# Patient Record
Sex: Female | Born: 1990 | Race: Black or African American | Hispanic: No | Marital: Married | Smoking: Never smoker
Health system: Southern US, Community
[De-identification: ages and names within clinical notes are randomized; demographics above are authoritative.]

## PROBLEM LIST (undated history)

## (undated) DIAGNOSIS — Z789 Other specified health status: Secondary | ICD-10-CM

## (undated) HISTORY — PX: NO PAST SURGERIES: SHX2092

## (undated) HISTORY — PX: ABDOMINAL HYSTERECTOMY: SHX81

---

## 2011-07-25 ENCOUNTER — Inpatient Hospital Stay (HOSPITAL_COMMUNITY): Payer: Medicaid Other

## 2011-07-25 ENCOUNTER — Encounter (HOSPITAL_COMMUNITY): Payer: Self-pay | Admitting: *Deleted

## 2011-07-25 ENCOUNTER — Inpatient Hospital Stay (HOSPITAL_COMMUNITY)
Admission: AD | Admit: 2011-07-25 | Discharge: 2011-08-10 | DRG: 782 | Disposition: A | Discharge status: Home / Self Care | Payer: Medicaid Other | Source: Ambulatory Visit | Attending: Obstetrics and Gynecology | Admitting: Obstetrics and Gynecology

## 2011-07-25 DIAGNOSIS — O4700 False labor before 37 completed weeks of gestation, unspecified trimester: Secondary | ICD-10-CM | POA: Diagnosis present

## 2011-07-25 DIAGNOSIS — O479 False labor, unspecified: Secondary | ICD-10-CM

## 2011-07-25 DIAGNOSIS — O4410 Placenta previa with hemorrhage, unspecified trimester: Principal | ICD-10-CM | POA: Diagnosis present

## 2011-07-25 HISTORY — DX: Other specified health status: Z78.9

## 2011-07-25 LAB — CBC
MCV: 82.4 fL (ref 78.0–100.0)
Platelets: 210 10*3/uL (ref 150–400)
RBC: 4.15 MIL/uL (ref 3.87–5.11)
RDW: 15.2 % (ref 11.5–15.5)
WBC: 7 10*3/uL (ref 4.0–10.5)

## 2011-07-25 MED ORDER — MAGNESIUM SULFATE 40 MG/ML IJ SOLN: 4.0000 g | Freq: Once | INTRAMUSCULAR | Status: DC

## 2011-07-25 MED ORDER — ZOLPIDEM TARTRATE 10 MG PO TABS
10.0000 mg | ORAL_TABLET | Freq: Every evening | ORAL | Status: DC | PRN
  Administered 2011-07-26 – 2011-08-07 (×5): 10 mg via ORAL
  Filled 2011-07-25 (×5): qty 1

## 2011-07-25 MED ORDER — MAGNESIUM SULFATE 40 G IN LACTATED RINGERS - SIMPLE
2.0000 g/h | INTRAVENOUS | Status: DC
  Administered 2011-07-25: 2 g/h via INTRAVENOUS
  Filled 2011-07-25: qty 500

## 2011-07-25 MED ORDER — PRENATAL PLUS 27-1 MG PO TABS
1.0000 | ORAL_TABLET | Freq: Every day | ORAL | Status: DC
  Administered 2011-07-25 – 2011-08-10 (×17): 1 via ORAL
  Filled 2011-07-25 (×20): qty 1

## 2011-07-25 MED ORDER — DOCUSATE SODIUM 100 MG PO CAPS
100.0000 mg | ORAL_CAPSULE | Freq: Every day | ORAL | Status: DC
  Administered 2011-07-26 – 2011-08-10 (×17): 100 mg via ORAL
  Filled 2011-07-25 (×22): qty 1

## 2011-07-25 MED ORDER — ACETAMINOPHEN 325 MG PO TABS: 650.0000 mg | ORAL_TABLET | ORAL | Status: DC | PRN

## 2011-07-25 MED ORDER — MAGNESIUM SULFATE BOLUS VIA INFUSION
4.0000 g | Freq: Once | INTRAVENOUS | Status: AC
  Administered 2011-07-25: 4 g via INTRAVENOUS
  Filled 2011-07-25: qty 500

## 2011-07-25 MED ORDER — LACTATED RINGERS IV SOLN
INTRAVENOUS | Status: DC
  Administered 2011-07-28: 03:00:00 via INTRAVENOUS

## 2011-07-25 MED ORDER — CALCIUM CARBONATE ANTACID 500 MG PO CHEW
2.0000 | CHEWABLE_TABLET | ORAL | Status: DC | PRN
  Administered 2011-07-27: 400 mg via ORAL
  Filled 2011-07-25 (×2): qty 2

## 2011-07-25 MED ORDER — LACTATED RINGERS IV SOLN
INTRAVENOUS | Status: DC
  Administered 2011-07-25: 125 mL/h via INTRAVENOUS
  Administered 2011-07-25 – 2011-07-28 (×8): via INTRAVENOUS
  Administered 2011-07-28: 125 mL/h via INTRAVENOUS
  Administered 2011-07-29 – 2011-07-31 (×5): via INTRAVENOUS

## 2011-07-25 MED ORDER — BETAMETHASONE SOD PHOS & ACET 6 (3-3) MG/ML IJ SUSP
12.0000 mg | INTRAMUSCULAR | Status: AC
  Administered 2011-07-25 – 2011-07-26 (×2): 12 mg via INTRAMUSCULAR
  Filled 2011-07-25 (×2): qty 2

## 2011-07-25 NOTE — Consult Note (Signed)
Neonatology Consult to Antenatal Patient:  Ms. Kari Collier is admitted today with onset of vaginal bleeding and complete placenta previa at 62 3/[redacted] weeks GA. She is currently having some contractions. She is getting BMZ and Magnesium sulfate. The EFW is 3 pounds and the fetus is a female.  I spoke with the patient and 2 female relatives. We discussed the worst case of delivery in the next 1-2 days, including usual DR management, possible respiratory complications and need for support, IV access, feedings (mother desires breast feeding, which was encouraged), LOS, Mortality and Morbidity, and long term outcomes. She did not have any questions at this time. I offered a NICU tour to any interested family members and would be glad to come back if she has more questions later.  Thank you for asking me to see this patient.  Deatra James, MD Neonatologist  Time spent: [phone]

## 2011-07-25 NOTE — Progress Notes (Signed)
Pt in c/o vaginal bleeding x40 minutes. Hx of low lying placenta. Abdominal pains with coughing. + FM.

## 2011-07-25 NOTE — H&P (Signed)
Kari Collier is a 20 y.o. female presenting for bleeding in pregnancy. Maternal Medical History:  Reason for admission: Reason for admission: vaginal bleeding.  Contractions: Onset was 1-2 hours ago.   Frequency: rare.   Perceived severity is mild.    Fetal activity: Perceived fetal activity is normal.   Last perceived fetal movement was within the past hour.    Prenatal complications: Bleeding (placenta previa known).     OB History    Grav Para Term Preterm Abortions TAB SAB Ect Mult Living   2 1 1  0 0 0 0 0 0 1     Past Medical History  Diagnosis Date  . No pertinent past medical history    Past Surgical History  Procedure Date  . No past surgeries    Family History: family history includes Diabetes in her father and Hypertension in her father. Social History:  reports that she has never smoked. She does not have any smokeless tobacco history on file. She reports that she does not drink alcohol or use illicit drugs.  Review of Systems  Constitutional: Negative for fever.  Respiratory: Positive for cough.   Gastrointestinal: Negative for abdominal pain.  Neurological: Negative for dizziness.      Blood pressure 116/72, pulse 96, temperature 97.1 F (36.2 C), temperature source Oral, resp. rate 18, height 5\' 5"  (1.651 m), weight 63.957 kg (141 lb). Maternal Exam:  Uterine Assessment: Contraction strength is mild.  Contraction frequency is regular.   Abdomen: Fundal height is 27.   Estimated fetal weight is 3lb.    Introitus: Normal vulva. Vulva is negative for lesion.  Vagina is positive for vaginal discharge (bloody discharge, large amt, orange sized clot removed).  Ferning test: not done.  Nitrazine test: not done. Amniotic fluid character: not assessed.  Cervix: Cervix evaluated by sterile speculum exam.     Fetal Exam Fetal Monitor Review: Mode: ultrasound.   Baseline rate: 140.  Variability: moderate (6-25 bpm).   Pattern: accelerations present and  no decelerations.    Fetal State Assessment: Category I - tracings are normal.     Physical Exam  Constitutional: She is oriented to person, place, and time. She appears well-developed and well-nourished. No distress.  HENT:  Head: Normocephalic.  Cardiovascular: Normal rate, regular rhythm and normal heart sounds.   Respiratory: Effort normal and breath sounds normal. No respiratory distress.  GI: Soft. She exhibits no distension and no mass. There is no tenderness. There is no rebound and no guarding.  Genitourinary: Uterus normal. Vulva exhibits no lesion. Vaginal discharge (bloody discharge, large amt, orange sized clot removed) found.  Musculoskeletal: Normal range of motion.  Neurological: She is alert and oriented to person, place, and time.  Skin: Skin is warm and dry.  Psychiatric: She has a normal mood and affect.    Cervical length 4.2cm, closed  Prenatal labs: ABO, Rh:   Antibody:   Rubella:   RPR:    HBsAg:    HIV:    GBS:     Assessment/Plan: A:  IUP at [redacted]w[redacted]d      Placenta Previa, posterior complete      Bleeding       Preterm Contractions P:  Admit per Dr Jolayne Panther     IV     Type and Cross 2 units      Observe      Discussed previa. Will observe in hospital.       Will need to request records Monday. [facility] does not have  any records from [facility] office).       Magnesium Sulfate tocolysis       Betamethasone, repeat tomorrow  [facility] 07/25/2011, 1:53 PM    Agree with above note.  Melany Wiesman 07/26/2011 6:28 AM

## 2011-07-25 NOTE — Plan of Care (Signed)
Problem: Consults Goal: Neonatologist Consult Outcome: Progressing NICU notified

## 2011-07-25 NOTE — Progress Notes (Signed)
Neonatologist notified of consult

## 2011-07-25 NOTE — Progress Notes (Signed)
Pt has hx of low lying placenta. Started she started bleeding  About 25 min ago.

## 2011-07-26 DIAGNOSIS — O4410 Placenta previa with hemorrhage, unspecified trimester: Principal | ICD-10-CM

## 2011-07-26 MED ORDER — NIFEDIPINE ER 30 MG PO TB24
30.0000 mg | ORAL_TABLET | Freq: Every day | ORAL | Status: DC
  Administered 2011-07-26 – 2011-07-29 (×4): 30 mg via ORAL
  Filled 2011-07-26 (×6): qty 1

## 2011-07-26 NOTE — Progress Notes (Signed)
Patient ID: [name], female   DOB: 1990/08/20, 20 y.o.   MRN: [id number] FACULTY PRACTICE ANTEPARTUM(COMPREHENSIVE) NOTE  Trysta Showman is a 20 y.o. G2P1001 at [redacted]w[redacted]d who is admitted for vaginal bleeding with placenta previa.   Fetal presentation is cephalic. Length of Stay:  1  Days  Subjective: Patient reports feeling better. Reports vaginal bleeding reduced to pink spotting. Patient reports that at some point in the early evening her contractions were q 30 minutes but she was able to get a good night rest. Patient reports the fetal movement as active. Patient reports uterine contraction  activity as irregular. Patient reports  vaginal bleeding as spotting. Patient describes fluid per vagina as None.  Vitals:  Blood pressure 94/51, pulse 103, temperature 97.8 F (36.6 C), temperature source Oral, resp. rate 18, height 5\' 5"  (1.651 m), weight 63.957 kg (141 lb), SpO2 96.00%, not currently breastfeeding. Physical Examination:  General appearance - alert, well appearing, and in no distress Fundal Height:  size equals dates Pelvic Exam:  examination not indicated Cervical Exam: Not evaluated. . Extremities: no edema, redness or tenderness in the calves or thighs with DTRs 2+ on the bilateral Membranes:intact  Fetal Monitoring:  Baseline: 120 bpm, Variability: Good {> 6 bpm), Accelerations: Reactive and Decelerations: Absent TOCO: no contractions  Labs:  Recent Results (from the past 24 hour(s))  CBC   Collection Time   07/25/11  2:00 PM      Component Value Range   WBC 7.0  4.0 - 10.5 (K/uL)   RBC 4.15  3.87 - 5.11 (MIL/uL)   Hemoglobin 11.3 (*) 12.0 - 15.0 (g/dL)   HCT 40.9 (*) 81.1 - 46.0 (%)   MCV 82.4  78.0 - 100.0 (fL)   MCH 27.2  26.0 - 34.0 (pg)   MCHC 33.0  30.0 - 36.0 (g/dL)   RDW 91.4  78.2 - 95.6 (%)   Platelets 210  150 - 400 (K/uL)  TYPE AND SCREEN   Collection Time   07/25/11  2:04 PM      Component Value Range   ABO/RH(D) O POS     Antibody Screen  NEG     Sample Expiration 07/28/2011     Unit Number [id number]     Blood Component Type RED CELLS,LR     Unit division 00     Status of Unit ALLOCATED     Transfusion Status OK TO TRANSFUSE     Crossmatch Result Compatible     Unit Number [id number]     Blood Component Type RED CELLS,LR     Unit division 00     Status of Unit ALLOCATED     Transfusion Status OK TO TRANSFUSE     Crossmatch Result Compatible    ABO/RH   Collection Time   07/25/11  2:10 PM      Component Value Range   ABO/RH(D) O POS    PREPARE RBC (CROSSMATCH)   Collection Time   07/25/11  2:30 PM      Component Value Range   Order Confirmation ORDER PROCESSED BY BLOOD BANK        Medications:  Scheduled    . betamethasone acetate-betamethasone sodium phosphate  12 mg Intramuscular Q24H  . docusate sodium  100 mg Oral Daily  . magnesium  4 g Intravenous Once  . prenatal vitamin w/FE, FA  1 tablet Oral Daily  . DISCONTD: magnesium sulfate IVPB  4 g Intravenous Once   I have reviewed the patient's current medications.  ASSESSMENT: There is no problem list on file for this patient.   PLAN: [id number] admitted with vaginal bleeding due to placenta previa - Fetal-maternal unit stable - Will discontinue magnesium sulfate and start procardia for tocolysis - Patient to receive second dose of betamethasone today - Cont close monitoring   Ruddy Swire 07/26/2011,6:29 AM

## 2011-07-26 NOTE — Progress Notes (Signed)
Patient ID: Kari Collier, female   DOB: 1990-10-17, 20 y.o.   MRN: [id number] Called to check pt for painless dark vag bleeding. Quarter sized dark bleeding noted on pad x 2.

## 2011-07-26 NOTE — Progress Notes (Signed)
Magnesium sulfate iv discontinued per md order

## 2011-07-27 DIAGNOSIS — O4410 Placenta previa with hemorrhage, unspecified trimester: Secondary | ICD-10-CM

## 2011-07-27 DIAGNOSIS — O4700 False labor before 37 completed weeks of gestation, unspecified trimester: Secondary | ICD-10-CM

## 2011-07-27 MED ORDER — PANTOPRAZOLE SODIUM 40 MG PO TBEC
40.0000 mg | DELAYED_RELEASE_TABLET | Freq: Every day | ORAL | Status: DC
  Administered 2011-07-27 – 2011-08-10 (×15): 40 mg via ORAL
  Filled 2011-07-27 (×16): qty 1

## 2011-07-27 NOTE — Progress Notes (Signed)
Patient ID: Kari Collier, female   DOB: 1991/01/01, 20 y.o.   MRN: [id number] S: C/O intermittant crampy type feeling and continued dark spotting. O: VSS, heart RRR, LCTAB, Abd soft non tender. FHR pattern reassuring. A: IUP @ 27 5, previa with vag bleeding.

## 2011-07-27 NOTE — Progress Notes (Signed)
UR chart review completed.  

## 2011-07-28 LAB — TYPE AND SCREEN
ABO/RH(D): O POS
Antibody Screen: NEGATIVE
Unit division: 0
Unit division: 0

## 2011-07-28 NOTE — Progress Notes (Signed)
Spotting noted on pt's pad, unsaturated, less than 1cm, will continue to monitor.

## 2011-07-28 NOTE — Progress Notes (Signed)
Patient ID: Kari Collier, female   DOB: 1990/10/14, 20 y.o.   MRN: [id number]  FACULTY PRACTICE ANTEPARTUM(COMPREHENSIVE) NOTE  Kari Collier is a 20 y.o. G2P1001 at [redacted]w[redacted]d  who is admitted for bleeding with previa. Length of Stay:  3  Days  Subjective: Pt had tightening 4x last night.  Scant bleeding  Vitals:  Blood pressure 110/51, pulse 84, temperature 98 F (36.7 C), temperature source Oral, resp. rate 18, height 5\' 5"  (1.651 m), weight 63.957 kg (141 lb), SpO2 96.00%, not currently breastfeeding. Physical Examination:  Abdominal Exam:   Pelvic Exam: No exam done due to previa Abdomen:  Soft, NT, ND Back:  NT Extremities: extremities normal, atraumatic, no cyanosis or edema    Medications:  Scheduled    . docusate sodium  100 mg Oral Daily  . NIFEdipine  30 mg Oral Daily  . pantoprazole  40 mg Oral Q1200  . prenatal vitamin w/FE, FA  1 tablet Oral Daily   I have reviewed the patient's current medications.  ASSESSMENT and PLAN: 20 yo female with bleeding secondary to previa Pt to go on monitor this a.m. Continue procardia and bed rest SCD are on LE   Kari Collier H. 07/28/2011,6:26 AM

## 2011-07-28 NOTE — Progress Notes (Signed)
Dr. Maren Reamer notified of pt status, FHR, UC pattern, bleeding noted on pt's pad and clots in BSC, less then 1cm.  No new orders received, will continue to monitor.

## 2011-07-29 NOTE — Progress Notes (Signed)
Subjective: Patient reports scant bleeding yesterday when using the bathroom.  Occasional contraction about every 20-30 minutes.  Reports good fetal activity.  No other complaints.  Objective: I have reviewed patient's vital signs and medications.  General: alert, cooperative and no distress Resp: normal respiratory effort. GI: soft, non-tender; bowel sounds normal; no masses,  no organomegaly Extremities: extremities normal, atraumatic, no cyanosis or edema  SCDs on   Assessment/Plan: 1.  G2P1001 with IUP at 28wks 2.  Placenta previa  Continue SCDs Continue procardia. Bed rest Tocometry.   LOS: 4 days    Ramesh Moan JEHIEL 07/29/2011, 9:53 AM

## 2011-07-30 MED ORDER — NIFEDIPINE ER 30 MG PO TB24
30.0000 mg | ORAL_TABLET | Freq: Two times a day (BID) | ORAL | Status: DC
  Administered 2011-07-30 – 2011-08-10 (×22): 30 mg via ORAL
  Filled 2011-07-30 (×25): qty 1

## 2011-07-30 NOTE — Progress Notes (Signed)
FACULTY PRACTICE ANTEPARTUM(COMPREHENSIVE) NOTE  Kari Collier is a 20 y.o. G2P1001 at [redacted]w[redacted]d by early ultrasound who is admitted for placenta previa with light spotting, now on Procardia XL 30 q am.   Fetal presentation is cephalic. Length of Stay:  5  Days  Subjective: Pt denies pain, still with lite pink d/c when voiding. No clots Patient reports the fetal movement as active. Patient reports uterine contraction  activity as only noted prior to voiding, then resolve. Patient reports  vaginal bleeding as lite pink noted with wiping after voiding. Patient describes fluid per vagina as None.  Vitals:  Blood pressure 105/68, pulse 96, temperature 97.8 F (36.6 C), temperature source Oral, resp. rate 20, height 5\' 5"  (1.651 m), weight 70.035 kg (154 lb 6.4 oz), SpO2 96.00%, not currently breastfeeding. Physical Examination:  General appearance - alert, well appearing, and in no distress Abdomen - soft, nontender, nondistended Fundal Height:  size equals dates Cervical Exam: Not evaluated. A Extremities: extremities normal, atraumatic, no cyanosis or edema and Homans sign is negative, no sign of DVT with DTRs 2+ bilaterally Membranes:intact  Fetal Monitoring:  very mild contractions mainly at night prior to voiding     I Medications:  Scheduled    . docusate sodium  100 mg Oral Daily  . NIFEdipine  30 mg Oral Daily  . pantoprazole  40 mg Oral Q1200  . prenatal vitamin w/FE, FA  1 tablet Oral Daily   I have reviewed the patient's current medications.  ASSESSMENT:Pregnancy 28.1 weeks Placenta previa,  There is no problem list on file for this patient.   PLAN: Increase Procardia XL 30 to bid. Continue bedrest c bedside commode  [name] V 07/30/2011,7:08 AM

## 2011-07-30 NOTE — Progress Notes (Signed)
UR Chart review completed.  

## 2011-07-31 LAB — TYPE AND SCREEN
ABO/RH(D): O POS
Antibody Screen: NEGATIVE
Unit division: 0

## 2011-07-31 NOTE — Progress Notes (Signed)
Patient ID: Kari Collier, female   DOB: [birth year], 20 y.o.   MRN: [id number] FACULTY PRACTICE ANTEPARTUM(COMPREHENSIVE) NOTE  Kari Collier is a 20 y.o. G2P1001 at [redacted]w[redacted]d by early ultrasound who is admitted for bleeding with placenta previa.   Fetal presentation is cephalic. Length of Stay:  6  Days  Subjective: No bleeding Patient reports the fetal movement as active. Patient reports uterine contraction  activity as none. Patient reports  vaginal bleeding as none. Patient describes fluid per vagina as None.  Vitals:  Blood pressure 105/57, pulse 95, temperature 98.2 F (36.8 C), temperature source Oral, resp. rate 20, height 5\' 5"  (1.651 m), weight 154 lb 6.4 oz (70.035 kg), SpO2 96.00%, not currently breastfeeding. Physical Examination:  General appearance - alert, well appearing, and in no distress Heart - normal rate and regular rhythm Abdomen - soft, nontender, nondistended Fundal Height:  size equals dates Cervical Exam: Not evaluated. and found to be not evaluated/ / and fetal presentation is cephalic. Extremities: extremities normal, atraumatic, no cyanosis or edema and Homans sign is negative, no sign of DVT with DTRs 2+ bilaterally Membranes:intact  Fetal Monitoring:  reactive no decels, no contractions  Labs:  No results found for this or any previous visit (from the past 24 hour(s)).  Imaging Studies:      Medications:  Scheduled    . docusate sodium  100 mg Oral Daily  . NIFEdipine  30 mg Oral BID  . pantoprazole  40 mg Oral Q1200  . prenatal vitamin w/FE, FA  1 tablet Oral Daily   I have reviewed the patient's current medications.  ASSESSMENT: [redacted]w[redacted]d Placenta previa no bleeding  PLAN: Continue to observe for recurrent bleeding  ARNOLD,JAMES 07/31/2011,7:34 AM

## 2011-08-01 DIAGNOSIS — O4410 Placenta previa with hemorrhage, unspecified trimester: Secondary | ICD-10-CM | POA: Diagnosis present

## 2011-08-01 DIAGNOSIS — O4700 False labor before 37 completed weeks of gestation, unspecified trimester: Secondary | ICD-10-CM | POA: Diagnosis present

## 2011-08-01 NOTE — Progress Notes (Addendum)
FACULTY PRACTICE ANTEPARTUM(COMPREHENSIVE) NOTE  Kari Collier is a 20 y.o. G2P1001 at [redacted]w[redacted]d who is admitted for bleeding previa.   Fetal presentation is unsure. Length of Stay:  7  Days  Subjective:  Patient reports the fetal movement as active. Patient reports uterine contraction  activity as none. Patient reports  vaginal bleeding as none. Last vaginal bleeding 07/30/11 Patient describes fluid per vagina as None.  Vitals:  Blood pressure 104/54, pulse 97, temperature 98.2 F (36.8 C), temperature source Oral, resp. rate 18, height 5\' 5"  (1.651 m), weight 70.035 kg (154 lb 6.4 oz), SpO2 96.00%, not currently breastfeeding. Physical Examination:  General appearance - alert, well appearing, and in no distress Heart - normal rate and regular rhythm Abdomen - soft, nontender, nondistended Fundal Height:  size equals dates Cervical Exam: Not evaluated.  Extremities: extremities normal, atraumatic, no cyanosis or edema and Homans sign is negative, no sign of DVT with DTRs 2+ bilaterally Membranes:intact  Fetal Monitoring:  Baseline: 145 bpm, Variability: Good {> 6 bpm), Accelerations: Reactive and Decelerations: Absent  Labs:  Recent Results (from the past 24 hour(s))  TYPE AND SCREEN   Collection Time   07/31/11  4:09 PM      Component Value Range   ABO/RH(D) O POS     Antibody Screen NEG     Sample Expiration 08/03/2011      Imaging Studies:      Medications:  Scheduled    . docusate sodium  100 mg Oral Daily  . NIFEdipine  30 mg Oral BID  . pantoprazole  40 mg Oral Q1200  . prenatal vitamin w/FE, FA  1 tablet Oral Daily   I have reviewed the patient's current medications.  ASSESSMENT: Previa w/out bleeding x 2 days  PLAN: Continue current POC.  Kari Collier 08/01/2011,8:25 AM

## 2011-08-02 MED ORDER — SODIUM CHLORIDE 0.9 % IJ SOLN
3.0000 mL | Freq: Two times a day (BID) | INTRAMUSCULAR | Status: DC
  Administered 2011-08-03 – 2011-08-09 (×13): 3 mL via INTRAVENOUS

## 2011-08-02 NOTE — Progress Notes (Signed)
Patient ID: Kari Collier, female   DOB: 09-Nov-1990, 20 y.o.   MRN: [id number] FACULTY PRACTICE ANTEPARTUM(COMPREHENSIVE) NOTE  Kari Collier is a 20 y.o. G2P1001 at [redacted]w[redacted]d who is admitted for bleeding previa.   Fetal presentation is unsure. Length of Stay:  8  Days  Subjective:  Patient reports the fetal movement as active. Patient reports uterine contraction  activity as none. Patient reports  vaginal bleeding as none. Last vaginal bleeding 07/30/11 Patient describes fluid per vagina as None.  Vitals:  Blood pressure 105/57, pulse 114, temperature 98.1 F (36.7 C), temperature source Oral, resp. rate 18, height 5\' 5"  (1.651 m), weight 70.035 kg (154 lb 6.4 oz), SpO2 96.00%, not currently breastfeeding. Physical Examination:  General appearance - alert, well appearing, and in no distress Heart - normal rate and regular rhythm Abdomen - soft, nontender, nondistended Fundal Height:  size equals dates Cervical Exam: Not evaluated.  Extremities: extremities normal, atraumatic, no cyanosis or edema and Homans sign is negative, no sign of DVT with DTRs 2+ bilaterally Membranes:intact  Fetal Monitoring:  Baseline 150, mod varibaility, no accels, no decels, appropriate for gestational age Toco: rare contractions  Labs:  No results found for this or any previous visit (from the past 24 hour(s)).  Imaging Studies:      Medications:  Scheduled    . docusate sodium  100 mg Oral Daily  . NIFEdipine  30 mg Oral BID  . pantoprazole  40 mg Oral Q1200  . prenatal vitamin w/FE, FA  1 tablet Oral Daily   I have reviewed the patient's current medications.  ASSESSMENT/PLAN: 20 yo G2P1001 @ [redacted]w[redacted]d with bleeding placenta previa - Fetal-maternal unit stable - no vaginal bleeding since 12/13 - continue current care    Olin Gurski 08/02/2011,6:41 AM

## 2011-08-03 LAB — TYPE AND SCREEN

## 2011-08-03 MED ORDER — POLYETHYLENE GLYCOL 3350 17 G PO PACK
17.0000 g | PACK | Freq: Every day | ORAL | Status: DC
  Administered 2011-08-03 – 2011-08-06 (×3): 17 g via ORAL
  Filled 2011-08-03 (×9): qty 1

## 2011-08-03 NOTE — Progress Notes (Signed)
Patient ID: Kari Collier, female   DOB: 1990/10/15, 20 y.o.   MRN: [id number] FACULTY PRACTICE ANTEPARTUM(COMPREHENSIVE) NOTE  Kari Collier is a 20 y.o. G2P1001 at 77w5dwho is admitted for bleeding posterior previa.   Fetal presentation is cephalic. Last bleeding 07/30/11 Length of Stay:  9  Days  Subjective: No complaints Patient reports the fetal movement as active. Patient reports uterine contraction  activity as iritability. Patient reports  vaginal bleeding as none. Patient describes fluid per vagina as None.  Vitals:  Blood pressure 102/54, pulse 94, temperature 98.5 F (36.9 C), temperature source Oral, resp. rate 20, height 5\' 5"  (1.651 m), weight 154 lb 6.4 oz (70.035 kg), SpO2 96.00%, not currently breastfeeding. Physical Examination:  General appearance - alert, well appearing, and in no distress, oriented to person, place, and time and normal appearing weight Mental status - alert, oriented to person, place, and time, normal mood, behavior, speech, dress, motor activity, and thought processes, affect appropriate to mood Abdomen - Gravid, non tender Musculoskeletal - no joint tenderness, deformity or swelling Extremities - peripheral pulses normal, no pedal edema, no clubbing or cyanosis   Fetal Monitoring:  Baseline: 140 bpm, Variability: Good {> 6 bpm) and Accelerations: Reactive  Labs:  No results found for this or any previous visit (from the past 24 hour(s)).  I  Medications:  Scheduled    . docusate sodium  100 mg Oral Daily  . NIFEdipine  30 mg Oral BID  . pantoprazole  40 mg Oral Q1200  . prenatal vitamin w/FE, FA  1 tablet Oral Daily  . sodium chloride  3 mL Intravenous Q12H   I have reviewed the patient's current medications.  ASSESSMENT:  Patient Active Problem List  Diagnoses  . Placenta previa with hemorrhage, antepartum  . Preterm uterine contractions, antepartum    PLAN: Stable status Continue in-patient management until 7 days w/o  bleeding D/c continuous toco, continue BID NST  Faun Mcqueen E. 08/03/2011,6:41 AM

## 2011-08-04 NOTE — Progress Notes (Signed)
Patient ID: [name], female   DOB: 1991/04/04, 20 y.o.   MRN: [id number] FACULTY PRACTICE ANTEPARTUM(COMPREHENSIVE) NOTE  Ipek Westra is a 20 y.o. G2P1001 at [redacted]w[redacted]d by best clinical estimate who is admitted for bleeding posterior placenta previa.   Fetal presentation is cephalic. Length of Stay:  10  Days  Subjective: No bleeding Patient reports the fetal movement as active. Patient reports uterine contraction  activity as none. Patient reports  vaginal bleeding as none. Patient describes fluid per vagina as None.  Vitals:  Blood pressure 109/45, pulse 92, temperature 98.1 F (36.7 C), temperature source Oral, resp. rate 20, height 5\' 5"  (1.651 m), weight 154 lb 6.4 oz (70.035 kg), SpO2 99.00%, not currently breastfeeding. Physical Examination:  General appearance - alert, well appearing, and in no distress Heart - normal rate and regular rhythm Abdomen - soft, nontender, nondistended Fundal Height:  size equals dates Cervical Exam: Not evaluated. and found to be not evaluated/ not evaluated/not evaluated and fetal presentation is cephalic. Extremities: extremities normal, atraumatic, no cyanosis or edema and Homans sign is negative, no sign of DVT with DTRs 2+ bilaterally Membranes:intact  Fetal Monitoring:  reassuring  Labs:  Recent Results (from the past 24 hour(s))  TYPE AND SCREEN   Collection Time   08/03/11  6:08 PM      Component Value Range   ABO/RH(D) O POS     Antibody Screen NEG     Sample Expiration 08/06/2011      Imaging Studies:    [place] Currently EPIC will not allow sonographic studies to automatically populate into notes.  In the meantime, copy and paste results into note or free text.  Medications:  Scheduled    . docusate sodium  100 mg Oral Daily  . NIFEdipine  30 mg Oral BID  . pantoprazole  40 mg Oral Q1200  . polyethylene glycol  17 g Oral Daily  . prenatal vitamin w/FE, FA  1 tablet Oral Daily  . sodium chloride  3 mL Intravenous  Q12H   I have reviewed the patient's current medications.  ASSESSMENT: Patient Active Problem List  Diagnoses  . Placenta previa with hemorrhage, antepartum  . Preterm uterine contractions, antepartum    PLAN: Hospitalize until no bleeding 7 days  Trajon Rosete 08/04/2011,7:34 AM

## 2011-08-05 NOTE — Progress Notes (Signed)
Patient ID: [name], female   DOB: 1991/03/02, 20 y.o.   MRN: [id number] FACULTY PRACTICE ANTEPARTUM(COMPREHENSIVE) NOTE  Cartina Brousseau is a 20 y.o. G2P1001 at [redacted]w[redacted]d  Subjective: Denies pain; had sm amt bright red vag bldg yesterday with some brown spotting afterwards; none now   Vitals:  Blood pressure 98/51, pulse 99, temperature 98.2 F (36.8 C), temperature source Oral, resp. rate 20, height 5\' 5"  (1.651 m), weight 70.035 kg (154 lb 6.4 oz), SpO2 99.00%, not currently breastfeeding. Physical Examination:  General appearance - alert, well appearing, and in no distress Abd: soft and NT Cervical Exam: Not evaluated Extremities: extremities normal, atraumatic, no cyanosis or edema  Fetal Monitoring:  Baseline: 135 bpm, Variability: Good {> 6 bpm) and Accelerations: Reactive on NST yesterday; pending for today; no ctx per toco    Medications:  Scheduled    . docusate sodium  100 mg Oral Daily  . NIFEdipine  30 mg Oral BID  . pantoprazole  40 mg Oral Q1200  . polyethylene glycol  17 g Oral Daily  . prenatal vitamin w/FE, FA  1 tablet Oral Daily  . sodium chloride  3 mL Intravenous Q12H   I have reviewed the patient's current medications.  ASSESSMENT: Patient Active Problem List  Diagnoses  . Placenta previa with hemorrhage, antepartum  . Preterm uterine contractions, antepartum    PLAN: IUP at 29wks Placenta previa with most recent bleed 12/18  Continue current care Planning d/c when no bldg x 1 week  Dulcemaria Bula 08/05/2011,7:21 AM

## 2011-08-06 NOTE — Progress Notes (Signed)
Admitted at  27 3/[redacted] weeks gestation, now 29 1/7 weeks with placenta previa.  Height  65 " Weight 152 Lbs  pre-pregnancy weight approx 130 Lbs.Pre-pregnancy  BMI 21.6   IBW 125 Lbs  Total weight gain 22 Lbs. Weight gain goals 25-35 Lbs.   Estimated needs: 18-2000 kcal/day, 65-75 g grams protein/day, 2 liters fluid/day regular diet tolerated well, appetite good. Current diet prescription will provide for increased needs. Patient is requesting larger portions. Will allow double portions and change diet order to antenatal regular to allow snacks  TID No abnormal nutrition related labs  Nutrition Dx: Increased nutrient needs r/t pregnancy and fetal growth requirements aeb [redacted] weeks gestation.  No educational needs assessed at this time.

## 2011-08-06 NOTE — Progress Notes (Signed)
Patient ID: [name], female   DOB: 1990/11/09, 20 y.o.   MRN: [id number]  FACULTY PRACTICE ANTEPARTUM NOTE  Kyera Felan is a 20 y.o. G2P1001 at [redacted]w[redacted]d  who is admitted for vaginal bleeding with placenta previa.  Length of Stay:  12  Days  Subjective:  Patient reports good fetal movement.  She reports no uterine contractions and no loss of fluid per vagina.  Last vaginal bleeding was 12/18.    Vitals:  Blood pressure 103/56, pulse 98, temperature 98.3 F (36.8 C), temperature source Oral, resp. rate 18, height 5\' 5"  (1.651 m), weight 69.355 kg (152 lb 14.4 oz), SpO2 99.00%, not currently breastfeeding. Physical Examination:  General appearance - alert, well appearing, and in no distress Chest - clear to auscultation, no wheezes, rales or rhonchi, symmetric air entry Heart - normal rate, regular rhythm, normal S1, S2, no murmurs, rubs, clicks or gallops Abdomen - soft, nontender, nondistended, no masses or organomegaly.  Fundal height approximately 27cm Extremities: extremities normal, atraumatic, no cyanosis or edema   Fetal Monitoring:  Baseline: 130 bpm, Variability: Good {> 6 bpm), Accelerations: Reactive and Decelerations: Absent  Labs:  No results found for this or any previous visit (from the past 24 hour(s)).  Medications:  Scheduled    . docusate sodium  100 mg Oral Daily  . NIFEdipine  30 mg Oral BID  . pantoprazole  40 mg Oral Q1200  . polyethylene glycol  17 g Oral Daily  . prenatal vitamin w/FE, FA  1 tablet Oral Daily  . sodium chloride  3 mL Intravenous Q12H   I have reviewed the patient's current medications.  ASSESSMENT: Patient Active Problem List  Diagnoses  . Placenta previa with hemorrhage, antepartum  . Preterm uterine contractions, antepartum    PLAN:  Continue routine antenatal care.  Continue tocolytics. D/c when no bleeding x 1 week.   STINSON, JACOB JEHIEL 08/06/2011,7:19 AM

## 2011-08-07 LAB — GLUCOSE TOLERANCE, 1 HOUR: Glucose, 1 Hour GTT: 170 mg/dL — ABNORMAL HIGH (ref 70–140)

## 2011-08-07 NOTE — Progress Notes (Signed)
UR Chart review completed.  

## 2011-08-07 NOTE — Progress Notes (Signed)
FACULTY PRACTICE ANTEPARTUM(COMPREHENSIVE) NOTE  Kari Collier is a 20 y.o. G2P1001 at [redacted]w[redacted]d  who is admitted for bleeding with placenta previa and contractions  Last bleed 12/18.   Length of Stay:  13  Days  Subjective: No bleeding, no contractions Patient reports the fetal movement as active. Patient reports uterine contraction  activity as none. Patient reports  vaginal bleeding as none. Patient describes fluid per vagina as None.  Vitals:  Blood pressure 109/50, pulse 88, temperature 98.3 F (36.8 C), temperature source Oral, resp. rate 20, height 5\' 5"  (1.651 m), weight 152 lb 14.4 oz (69.355 kg), SpO2 99.00%, not currently breastfeeding. Physical Examination:  General appearance - alert, well appearing, and in no distress Heart - normal rate and regular rhythm Abdomen - soft, nontender, nondistended Fundal Height:  size equals dates Cervical Exam: Not evaluated.. Extremities: extremities normal, atraumatic, no cyanosis or edema and Homans sign is negative, no sign of DVT with DTRs 2+ bilaterally Membranes:intact  Fetal Monitoring:  Baseline: 140 bpm, Variability: Good {> 6 bpm), Accelerations: Non-reactive but appropriate for gestational age and Decelerations: Absent  Labs:  Recent Results (from the past 24 hour(s))  TYPE AND SCREEN   Collection Time   08/06/11  3:25 PM      Component Value Range   ABO/RH(D) O POS     Antibody Screen NEG     Sample Expiration 08/09/2011      Imaging Studies:     Currently EPIC will not allow sonographic studies to automatically populate into notes.  In the meantime, copy and paste results into note or free text.  Medications:  Scheduled    . docusate sodium  100 mg Oral Daily  . NIFEdipine  30 mg Oral BID  . pantoprazole  40 mg Oral Q1200  . polyethylene glycol  17 g Oral Daily  . prenatal vitamin w/FE, FA  1 tablet Oral Daily  . sodium chloride  3 mL Intravenous Q12H   I have reviewed the patient's current  medications.  ASSESSMENT: Patient Active Problem List  Diagnoses  . Placenta previa with hemorrhage, antepartum  . Preterm uterine contractions, antepartum    PLAN: Expectant management.  Plan d/c 1 week after last bleed, at this point, it would be 12/25  [name],[name] 08/07/2011,7:21 AM

## 2011-08-08 LAB — GLUCOSE, 3 HOUR GESTATIONAL: Glucose, GTT - 3 Hour: 174 mg/dL — ABNORMAL HIGH (ref 70–144)

## 2011-08-08 LAB — GLUCOSE, CAPILLARY
Glucose-Capillary: 69 mg/dL — ABNORMAL LOW (ref 70–99)
Glucose-Capillary: 112 mg/dL — ABNORMAL HIGH (ref 70–99)

## 2011-08-08 LAB — GLUCOSE, FASTING GESTATIONAL: Glucose Tolerance, Fasting: 86 mg/dL

## 2011-08-08 NOTE — Progress Notes (Signed)
Patient ID: Kari Collier, female   DOB: 1990-11-01, 20 y.o.   MRN: [id number] FACULTY PRACTICE ANTEPARTUM(COMPREHENSIVE) NOTE  Kari Collier is a 20 y.o. G2P1001 at [redacted]w[redacted]d  who is admitted for bleeding with placenta previa and contractions  Last bleed 12/18.   Length of Stay:  14  Days  Subjective: No bleeding, no contractions Patient reports the fetal movement as active. Patient reports uterine contraction  activity as none. Patient reports  vaginal bleeding as none. Patient describes fluid per vagina as None.  Vitals:  Blood pressure 105/58, pulse 87, temperature 97.6 F (36.4 C), temperature source Oral, resp. rate 18, height 5\' 5"  (1.651 m), weight 69.355 kg (152 lb 14.4 oz), SpO2 99.00%, not currently breastfeeding. Physical Examination:  General appearance - alert, well appearing, and in no distress Heart - normal rate and regular rhythm Abdomen - soft, nontender, nondistended Fundal Height:  size equals dates Cervical Exam: Not evaluated.. Extremities: extremities normal, atraumatic, no cyanosis or edema and Homans sign is negative, no sign of DVT with DTRs 2+ bilaterally Membranes:intact  Fetal Monitoring:  Baseline: 140 bpm, Variability: Good {> 6 bpm), Accelerations: Non-reactive but appropriate for gestational age and Decelerations: Absent  Labs:  Recent Results (from the past 24 hour(s))  GLUCOSE TOLERANCE, 1 HOUR   Collection Time   08/07/11 10:17 AM      Component Value Range   Glucose, 1 Hour GTT 170 (*) 70 - 140 (mg/dL)  GLUCOSE, FASTING GESTATIONAL   Collection Time   08/08/11  6:30 AM      Component Value Range   Glucose, Fasting-Gestational 86    GLUCOSE, 1 HOUR GESTATIONAL   Collection Time   08/08/11  7:26 AM      Component Value Range   Glucose, 1 Hour-Gestational 190 (*) 70 - 189 (mg/dL)    Imaging Studies:     Currently EPIC will not allow sonographic studies to automatically populate into notes.  In the meantime, copy and paste results into  note or free text.  Medications:  Scheduled    . docusate sodium  100 mg Oral Daily  . NIFEdipine  30 mg Oral BID  . pantoprazole  40 mg Oral Q1200  . polyethylene glycol  17 g Oral Daily  . prenatal vitamin w/FE, FA  1 tablet Oral Daily  . sodium chloride  3 mL Intravenous Q12H   I have reviewed the patient's current medications.  ASSESSMENT: Patient Active Problem List  Diagnoses  . Placenta previa with hemorrhage, antepartum  . Preterm uterine contractions, antepartum    PLAN: Expectant management.   Plan d/c on 12/24 Follow-up remaining values of 3hr GCT  Jamilia Jacques 08/08/2011,9:14 AM

## 2011-08-09 LAB — TYPE AND SCREEN
ABO/RH(D): O POS
ABO/RH(D): O POS
Antibody Screen: NEGATIVE

## 2011-08-09 LAB — GLUCOSE, CAPILLARY: Glucose-Capillary: 73 mg/dL (ref 70–99)

## 2011-08-09 NOTE — Progress Notes (Signed)
  Nutrition Dx: Food and nutrition-related knowledge deficit r/t no previous education aeb newly diagnosed GDM.   Nutrition education consult for Carbohydrate Modified Gestational Diabetic Diet completed.  "Meal  plan for gestational diabetics" handout given to patient.  Basic concepts reviewed.  Questions answered.  Patient verbalizes understanding.     

## 2011-08-09 NOTE — Progress Notes (Signed)
Patient ID: Kari Collier, female   DOB: July 03, 1991, 20 y.o.   MRN: [id number] FACULTY PRACTICE ANTEPARTUM(COMPREHENSIVE) NOTE  Kari Collier is a 20 y.o. G2P1001 at [redacted]w[redacted]d  who is admitted for bleeding with placenta previa and contractions  Last bleed 12/18.   Length of Stay:  15  Days  Subjective: No bleeding, no contractions Patient reports the fetal movement as active. Patient reports uterine contraction  activity as none. Patient reports  vaginal bleeding as none. Patient describes fluid per vagina as None.  Vitals:  Blood pressure 101/49, pulse 87, temperature 97.8 F (36.6 C), temperature source Oral, resp. rate 18, height 5\' 5"  (1.651 m), weight 69.355 kg (152 lb 14.4 oz), SpO2 99.00%, not currently breastfeeding. Physical Examination:  General appearance - alert, well appearing, and in no distress Heart - normal rate and regular rhythm Abdomen - soft, nontender, nondistended Fundal Height:  size equals dates Cervical Exam: Not evaluated.. Extremities: extremities normal, atraumatic, no cyanosis or edema and Homans sign is negative, no sign of DVT with DTRs 2+ bilaterally Membranes:intact  Fetal Monitoring:  Baseline: 140 bpm, Variability: Good {> 6 bpm), Accelerations: Non-reactive but appropriate for gestational age and Decelerations: Absent Toco: no contractions  Labs:  Recent Results (from the past 24 hour(s))  GLUCOSE, 1 HOUR GESTATIONAL   Collection Time   08/08/11  7:26 AM      Component Value Range   Glucose, 1 Hour-Gestational 190 (*) 70 - 189 (mg/dL)  GLUCOSE, 2 HOUR GESTATIONAL   Collection Time   08/08/11  8:32 AM      Component Value Range   Glucose, 2 Hour-Gestational 195 (*) 70 - 164 (mg/dL)  GLUCOSE, 3 HOUR GESTATIONAL   Collection Time   08/08/11  9:35 AM      Component Value Range   Glucose, GTT - 3 Hour 174 (*) 70 - 144 (mg/dL)  GLUCOSE, CAPILLARY   Collection Time   08/08/11  1:27 PM      Component Value Range   Glucose-Capillary 69 (*)  70 - 99 (mg/dL)  GLUCOSE, CAPILLARY   Collection Time   08/08/11  6:01 PM      Component Value Range   Glucose-Capillary 98  70 - 99 (mg/dL)  GLUCOSE, CAPILLARY   Collection Time   08/08/11 10:47 PM      Component Value Range   Glucose-Capillary 112 (*) 70 - 99 (mg/dL)   Comment 1 Documented in Chart    GLUCOSE, CAPILLARY   Collection Time   08/09/11  6:26 AM      Component Value Range   Glucose-Capillary 73  70 - 99 (mg/dL)   Comment 1 Documented in Chart      Imaging Studies:     Currently EPIC will not allow sonographic studies to automatically populate into notes.  In the meantime, copy and paste results into note or free text.  Medications:  Scheduled    . docusate sodium  100 mg Oral Daily  . NIFEdipine  30 mg Oral BID  . pantoprazole  40 mg Oral Q1200  . polyethylene glycol  17 g Oral Daily  . prenatal vitamin w/FE, FA  1 tablet Oral Daily  . sodium chloride  3 mL Intravenous Q12H   I have reviewed the patient's current medications.  ASSESSMENT: Patient Active Problem List  Diagnoses  . Placenta previa with hemorrhage, antepartum  . Preterm uterine contractions, antepartum    PLAN: Expectant management.   Plan d/c on 12/24 CBG's well controlled with diet thus  far. Continue monitoring  Jylan Loeza 08/09/2011,6:42 AM

## 2011-08-09 NOTE — Progress Notes (Signed)
Dietician at pt's bedside.

## 2011-08-10 LAB — GLUCOSE, CAPILLARY: Glucose-Capillary: 90 mg/dL (ref 70–99)

## 2011-08-10 MED ORDER — POLYETHYLENE GLYCOL 3350 17 G PO PACK: 17.0000 g | PACK | Freq: Every day | ORAL | Status: AC

## 2011-08-10 MED ORDER — NIFEDIPINE ER 30 MG PO TB24: 30.0000 mg | ORAL_TABLET | Freq: Two times a day (BID) | ORAL | Status: DC

## 2011-08-10 MED ORDER — PANTOPRAZOLE SODIUM 40 MG PO TBEC: 40.0000 mg | DELAYED_RELEASE_TABLET | Freq: Every day | ORAL | Status: AC

## 2011-08-10 MED ORDER — DSS 100 MG PO CAPS: 100.0000 mg | ORAL_CAPSULE | Freq: Every day | ORAL | Status: AC

## 2011-08-10 MED ORDER — ONETOUCH ULTRA SYSTEM W/DEVICE KIT: 1.0000 | PACK | Freq: Once | Status: DC

## 2011-08-10 MED ORDER — GLUCOSE BLOOD VI STRP: ORAL_STRIP | Status: DC

## 2011-08-10 MED ORDER — ONETOUCH LANCETS MISC: 1.0000 | Freq: Four times a day (QID) | Status: DC

## 2011-08-10 NOTE — Progress Notes (Signed)
Patient ID: Kari Collier, female   DOB: [birth year], 20 y.o.   MRN: [id number] FACULTY PRACTICE ANTEPARTUM(COMPREHENSIVE) NOTE  Kari Collier is a 20 y.o. G2P1001 at 108w5d  who is admitted for bleeding with placenta previa and contractions  Last bleed 12/18.   Length of Stay:  16  Days  Subjective: No bleeding bright red bleeding, no contractions. Patient has been spotting dark blood for the past two days Patient reports the fetal movement as active. Patient reports uterine contraction  activity as none. Patient reports  vaginal bleeding as none. Patient describes fluid per vagina as None.  Vitals:  Blood pressure 106/57, pulse 87, temperature 96.9 F (36.1 C), temperature source Axillary, resp. rate 16, height 5\' 5"  (1.651 m), weight 69.355 kg (152 lb 14.4 oz), SpO2 99.00%, not currently breastfeeding. Physical Examination:  General appearance - alert, well appearing, and in no distress Heart - normal rate and regular rhythm Abdomen - soft, nontender, nondistended Fundal Height:  size equals dates Cervical Exam: Not evaluated.. Extremities: extremities normal, atraumatic, no cyanosis or edema and Homans sign is negative, no sign of DVT with DTRs 2+ bilaterally Membranes:intact  Fetal Monitoring:  Baseline: 140 bpm, Variability: Good {> 6 bpm), Accelerations: Non-reactive but appropriate for gestational age and Decelerations: Absent Toco: no contractions  Labs:  Recent Results (from the past 24 hour(s))  GLUCOSE, CAPILLARY   Collection Time   08/09/11 12:07 PM      Component Value Range   Glucose-Capillary 85  70 - 99 (mg/dL)  GLUCOSE, CAPILLARY   Collection Time   08/09/11  4:04 PM      Component Value Range   Glucose-Capillary 106 (*) 70 - 99 (mg/dL)  TYPE AND SCREEN   Collection Time   08/09/11  4:05 PM      Component Value Range   ABO/RH(D) O POS     Antibody Screen NEG     Sample Expiration 08/12/2011    GLUCOSE, CAPILLARY   Collection Time   08/09/11 10:26  PM      Component Value Range   Glucose-Capillary 113 (*) 70 - 99 (mg/dL)  GLUCOSE, CAPILLARY   Collection Time   08/10/11  6:05 AM      Component Value Range   Glucose-Capillary 74  70 - 99 (mg/dL)    Imaging Studies:     Currently EPIC will not allow sonographic studies to automatically populate into notes.  In the meantime, copy and paste results into note or free text.  Medications:  Scheduled    . docusate sodium  100 mg Oral Daily  . NIFEdipine  30 mg Oral BID  . pantoprazole  40 mg Oral Q1200  . polyethylene glycol  17 g Oral Daily  . prenatal vitamin w/FE, FA  1 tablet Oral Daily  . sodium chloride  3 mL Intravenous Q12H   I have reviewed the patient's current medications.  ASSESSMENT: Patient Active Problem List  Diagnoses  . Placenta previa with hemorrhage, antepartum  . Preterm uterine contractions, antepartum    PLAN: Expectant management.   CBG's well controlled with diet thus far. Continue monitoring Will continue to observe and discuss with colleague d/c planning for later today   Kari Collier 08/10/2011,8:36 AM

## 2011-08-10 NOTE — Progress Notes (Signed)
Patient ID: Kari Collier, female   DOB: 18-Oct-1990, 20 y.o.   MRN: [id number]  Discussed case with team.  Reviewed note by Dr. Jolayne Panther.  Pt had bright red bleeding 6 1/2 days ago.  There was one spot of brown blood when she wiped 2 days ago.  There has been no bleeding or spotting of any color for 2 days.    FHT reactive and reassuring.  No contractions.  Pt given strict previa instructions.  Pt lives very near [facility] and will go there if she begins to bleed.  She will follow up with [place] in [facility] clinic in one week.  Pt can continue her care in [facility] if she wishes.  Kari Collier H. 2:08 PM

## 2011-08-10 NOTE — Progress Notes (Signed)
Dr. Penne Lash at bedside and notified of pt status, FHR, UC pattern, and no bleeding noted on pt's pad and no bleeding per pt.  Dr. Penne Lash ordered pt to be D/C home with instructions.  Will continue to monitor.

## 2011-08-10 NOTE — Discharge Summary (Signed)
Obstetric Discharge Summary Reason for Admission: previa bleeding Prenatal Procedures: NST and ultrasound Intrapartum Procedures: n/a Postpartum Procedures: none Complications-Operative and Postpartum: n/a  pt still pregnant Hemoglobin  Date Value Range Status  07/25/2011 11.3* 12.0-15.0 (g/dL) Final     HCT  Date Value Range Status  07/25/2011 34.2* 36.0-46.0 (%) Final    Discharge Diagnoses: Placenta previa  Discharge Information: Date: 08/10/2011 Activity: pelvic rest and bed rest withbathroom priviliges Diet: routine and gestational diabetic diet Medications: procardia, colace, miralax, protonix Condition: improved Instructions: check glucose with meter fasting and 2 hours pp Discharge to: home Follow-up Information    Follow up with [facility] in 1 week.          Kari Cosey H. 08/10/2011, 2:01 PM

## 2011-08-10 NOTE — Progress Notes (Signed)
Pt D/C home with instructions, pt verbalized understandings.  EFMs removed.

## 2011-08-10 NOTE — Progress Notes (Signed)
UR chart review completed.  

## 2011-08-17 ENCOUNTER — Ambulatory Visit (INDEPENDENT_AMBULATORY_CARE_PROVIDER_SITE_OTHER): Payer: Medicaid Other | Admitting: Advanced Practice Midwife

## 2011-08-17 DIAGNOSIS — O4410 Placenta previa with hemorrhage, unspecified trimester: Secondary | ICD-10-CM

## 2011-08-17 DIAGNOSIS — O2390 Unspecified genitourinary tract infection in pregnancy, unspecified trimester: Secondary | ICD-10-CM

## 2011-08-17 DIAGNOSIS — O2340 Unspecified infection of urinary tract in pregnancy, unspecified trimester: Secondary | ICD-10-CM | POA: Insufficient documentation

## 2011-08-17 DIAGNOSIS — O0990 Supervision of high risk pregnancy, unspecified, unspecified trimester: Secondary | ICD-10-CM | POA: Insufficient documentation

## 2011-08-17 DIAGNOSIS — O4700 False labor before 37 completed weeks of gestation, unspecified trimester: Secondary | ICD-10-CM

## 2011-08-17 DIAGNOSIS — O24919 Unspecified diabetes mellitus in pregnancy, unspecified trimester: Secondary | ICD-10-CM

## 2011-08-17 DIAGNOSIS — O99810 Abnormal glucose complicating pregnancy: Secondary | ICD-10-CM

## 2011-08-17 DIAGNOSIS — O24419 Gestational diabetes mellitus in pregnancy, unspecified control: Secondary | ICD-10-CM

## 2011-08-17 DIAGNOSIS — O4400 Placenta previa specified as without hemorrhage, unspecified trimester: Secondary | ICD-10-CM

## 2011-08-17 DIAGNOSIS — N390 Urinary tract infection, site not specified: Secondary | ICD-10-CM

## 2011-08-17 LAB — POCT URINALYSIS DIP (DEVICE)
Glucose, UA: NEGATIVE mg/dL
Nitrite: POSITIVE — AB

## 2011-08-17 MED ORDER — CEPHALEXIN 500 MG PO CAPS: 500.0000 mg | ORAL_CAPSULE | Freq: Four times a day (QID) | ORAL | Status: AC

## 2011-08-17 NOTE — Progress Notes (Signed)
FBS: 78-80  2hr PC  92-101 (does not have book, will provide today). Has testing supplies. +nitrites >> will culture urine and treat today with keflex.  Will get OB labs (not completed in hospital). No bleeding.

## 2011-08-17 NOTE — Progress Notes (Signed)
Pulse-87 Pt given info on Tdap and flu vaccine

## 2011-08-17 NOTE — Patient Instructions (Signed)
Gestational Diabetes Mellitus °Gestational diabetes mellitus (GDM) is diabetes that occurs only during pregnancy. This happens when the body cannot properly handle the glucose (sugar) that increases in the blood after eating. During pregnancy, insulin resistance (reduced sensitivity to insulin) occurs because of the release of hormones from the placenta. Usually, the pancreas of pregnant women produces enough insulin to overcome the resistance that occurs. However, in gestational diabetes, the insulin is there but it does not work effectively. If the resistance is severe enough that the pancreas does not produce enough insulin, extra glucose builds up in the blood.  °WHO IS AT RISK FOR DEVELOPING GESTATIONAL DIABETES? °· Women with a history of diabetes in the family.  °· Women over age 25.  °· Women who are overweight.  °· Women in certain ethnic groups (Hispanic, African American, Native American, Asian and Pacific Islander).  °WHAT CAN HAPPEN TO THE BABY? °If the mother's blood glucose is too high while she is pregnant, the extra sugar will travel through the umbilical cord to the baby. Some of the problems the baby may have are: °· Large Baby - If the baby receives too much sugar, the baby will gain more weight. This may cause the baby to be too large to be born normally (vaginally) and a Cesarean section (C-section) may be needed.  °· Low Blood Glucose (hypoglycemia) - The baby makes extra insulin, in response to the extra sugar its gets from its mother. When the baby is born and no longer needs this extra insulin, the baby's blood glucose level may drop.  °· Jaundice (yellow coloring of the skin and eyes) - This is fairly common in babies. It is caused from a build-up of the chemical called bilirubin. This is rarely serious, but is seen more often in babies whose mothers had gestational diabetes.  °RISKS TO THE MOTHER °Women who have had gestational diabetes may be at higher risk for some problems,  including: °· Preeclampsia or toxemia, which includes problems with high blood pressure. Blood pressure and protein levels in the urine must be checked frequently.  °· Infections.  °· Cesarean section (C-section) for delivery.  °· Developing Type 2 diabetes later in life. About 30-50% will develop diabetes later, especially if obese.  °DIAGNOSIS  °The hormones that cause insulin resistance are highest at about 24-28 weeks of pregnancy. If symptoms are experienced, they are much like symptoms you would normally expect during pregnancy.  °GDM is often diagnosed using a two part method: °1. After 24-28 weeks of pregnancy, the woman drinks a glucose solution and takes a blood test. If the glucose level is high, a second test will be given.  °2. Oral Glucose Tolerance Test (OGTT) which is 3 hours long - After not eating overnight, the blood glucose is checked. The woman drinks a glucose solution, and hourly blood glucose tests are taken.  °If the woman has risk factors for GDM, the caregiver may test earlier than 24 weeks of pregnancy. °TREATMENT  °Treatment of GDM is directed at keeping the mother's blood glucose level normal, and may include: °· Meal planning.  °· Taking insulin or other medicine to control your blood glucose level.  °· Exercise.  °· Keeping a daily record of the foods you eat.  °· Blood glucose monitoring and keeping a record of your blood glucose levels.  °· May monitor ketone levels in the urine, although this is no longer considered necessary in most pregnancies.  °HOME CARE INSTRUCTIONS  °While you are pregnant: °·   Follow your caregiver's advice regarding your prenatal appointments, meal planning, exercise, medicines, vitamins, blood and other tests, and physical activities.  °· Keep a record of your meals, blood glucose tests, and the amount of insulin you are taking (if any). Show this to your caregiver at every prenatal visit.  °· If you have GDM, you may have problems with hypoglycemia (low  blood glucose). You may suspect this if you become suddenly dizzy, feel shaky, and/or weak. If you think this is happening and you have a glucose meter, try to test your blood glucose level. Follow your caregiver's advice for when and how to treat your low blood glucose. Generally, the 15:15 rule is followed: Treat by consuming 15 grams of carbohydrates, wait 15 minutes, and recheck blood glucose. Examples of 15 grams of carbohydrates are:  °· 1 cup skim or low-fat milk.  °· ½ cup juice.  °· 3-4 glucose tablets.  °· 5-6 hard candies.  °· 1 small box raisins.  °· ½ cup regular soda pop.  °· Practice good hygiene, to avoid infections.  °· Do not smoke.  °SEEK MEDICAL CARE IF:  °· You develop abnormal vaginal discharge, with or without itching.  °· You become weak and tired more than expected.  °· You seem to sweat a lot.  °· You have a sudden increase in weight, 5 pounds or more in one week.  °· You are losing weight, 3 pounds or more in a week.  °· Your blood glucose level is high, and you need instructions on what to do about it.  °SEEK IMMEDIATE MEDICAL CARE IF:  °· You develop a severe headache.  °· You faint or pass out.  °· You develop nausea and vomiting.  °· You become disoriented or confused.  °· You have a convulsion.  °· You develop vision problems.  °· You develop stomach pain.  °· You develop vaginal bleeding.  °· You develop uterine contractions.  °· You have leaking or a gush of fluid from the vagina.  °AFTER YOU HAVE THE BABY: °· Go to all of your follow-up appointments, and have blood tests as advised by your caregiver.  °· Maintain a healthy lifestyle, to prevent diabetes in the future. This includes:  °· Following a healthy meal plan.  °· Controlling your weight.  °· Getting enough exercise and proper rest.  °· Do not smoke.  °· Breastfeed your baby if you can. This will lower the chance of you and your baby developing diabetes later in life.  °For more information about diabetes, go to the American  Diabetes Association at: www.americandiabetesassociation.org. °For more information about gestational diabetes, go to the American Congress of Obstetricians and Gynecologists at: www.acog.org. °Document Released: 11/09/2000 Document Revised: 04/15/2011 Document Reviewed: 06/03/2009 °Exit[employer]® Patient Information ©2012 [employer]. °Pregnancy - Third Trimester °The third trimester of pregnancy (the last 3 months) is a period of the most rapid growth for you and your baby. The baby approaches a length of 20 inches and a weight of 6 to 10 pounds. The baby is adding on fat and getting ready for life outside your body. While inside, babies have periods of sleeping and waking, suck their thumbs, and hiccups. You can often feel small contractions of the uterus. This is false labor. It is also called Braxton-Hicks contractions. This is like a practice for labor. The usual problems in this stage of pregnancy include more difficulty breathing, swelling of the hands and feet from water retention, and having to urinate more   often because of the uterus and baby pressing on your bladder.  °PRENATAL EXAMS °· Blood work may continue to be done during prenatal exams. These tests are done to check on your health and the probable health of your baby. Blood work is used to follow your blood levels (hemoglobin). Anemia (low hemoglobin) is common during pregnancy. Iron and vitamins are given to help prevent this. You may also continue to be checked for diabetes. Some of the past blood tests may be done again.  °· The size of the uterus is measured during each visit. This makes sure your baby is growing properly according to your pregnancy dates.  °· Your blood pressure is checked every prenatal visit. This is to make sure you are not getting toxemia.  °· Your urine is checked every prenatal visit for infection, diabetes and protein.  °· Your weight is checked at each visit. This is done to make sure gains are happening at the suggested  rate and that you and your baby are growing normally.  °· Sometimes, an ultrasound is performed to confirm the position and the proper growth and development of the baby. This is a test done that bounces harmless sound waves off the baby so your caregiver can more accurately determine due dates.  °· Discuss the type of pain medication and anesthesia you will have during your labor and delivery.  °· Discuss the possibility and anesthesia if a Cesarean Section might be necessary.  °· Inform your caregiver if there is any mental or physical violence at home.  °Sometimes, a specialized non-stress test, contraction stress test and biophysical profile are done to make sure the baby is not having a problem. Checking the amniotic fluid surrounding the baby is called an amniocentesis. The amniotic fluid is removed by sticking a needle into the belly (abdomen). This is sometimes done near the end of pregnancy if an early delivery is required. In this case, it is done to help make sure the baby's lungs are mature enough for the baby to live outside of the womb. If the lungs are not mature and it is unsafe to deliver the baby, an injection of cortisone medication is given to the mother 1 to 2 days before the delivery. This helps the baby's lungs mature and makes it safer to deliver the baby. °CHANGES OCCURING IN THE THIRD TRIMESTER OF PREGNANCY °Your body goes through many changes during pregnancy. They vary from person to person. Talk to your caregiver about changes you notice and are concerned about. °· During the last trimester, you have probably had an increase in your appetite. It is normal to have cravings for certain foods. This varies from person to person and pregnancy to pregnancy.  °· You may begin to get stretch marks on your hips, abdomen, and breasts. These are normal changes in the body during pregnancy. There are no exercises or medications to take which prevent this change.  °· Constipation may be treated with  a stool softener or adding bulk to your diet. Drinking lots of fluids, fiber in vegetables, fruits, and whole grains are helpful.  °· Exercising is also helpful. If you have been very active up until your pregnancy, most of these activities can be continued during your pregnancy. If you have been less active, it is helpful to start an exercise program such as walking. Consult your caregiver before starting exercise programs.  °· Avoid all smoking, alcohol, un-prescribed drugs, herbs and "street drugs" during your pregnancy. These chemicals affect   the formation and growth of the baby. Avoid chemicals throughout the pregnancy to ensure the delivery of a healthy infant.  °· Backache, varicose veins and hemorrhoids may develop or get worse.  °· You will tire more easily in the third trimester, which is normal.  °· The baby's movements may be stronger and more often.  °· You may become short of breath easily.  °· Your belly button may stick out.  °· A yellow discharge may leak from your breasts called colostrum.  °· You may have a bloody mucus discharge. This usually occurs a few days to a week before labor begins.  °HOME CARE INSTRUCTIONS  °· Keep your caregiver's appointments. Follow your caregiver's instructions regarding medication use, exercise, and diet.  °· During pregnancy, you are providing food for you and your baby. Continue to eat regular, well-balanced meals. Choose foods such as meat, fish, milk and other low fat dairy products, vegetables, fruits, and whole-grain breads and cereals. Your caregiver will tell you of the ideal weight gain.  °· A physical sexual relationship may be continued throughout pregnancy if there are no other problems such as early (premature) leaking of amniotic fluid from the membranes, vaginal bleeding, or belly (abdominal) pain.  °· Exercise regularly if there are no restrictions. Check with your caregiver if you are unsure of the safety of your exercises. Greater weight gain will  occur in the last 2 trimesters of pregnancy. Exercising helps:  °· Control your weight.  °· Get you in shape for labor and delivery.  °· You lose weight after you deliver.  °· Rest a lot with legs elevated, or as needed for leg cramps or low back pain.  °· Wear a good support or jogging bra for breast tenderness during pregnancy. This may help if worn during sleep. Pads or tissues may be used in the bra if you are leaking colostrum.  °· Do not use hot tubs, steam rooms, or saunas.  °· Wear your seat belt when driving. This protects you and your baby if you are in an accident.  °· Avoid raw meat, cat litter boxes and soil used by cats. These carry germs that can cause birth defects in the baby.  °· It is easier to loose urine during pregnancy. Tightening up and strengthening the pelvic muscles will help with this problem. You can practice stopping your urination while you are going to the bathroom. These are the same muscles you need to strengthen. It is also the muscles you would use if you were trying to stop from passing gas. You can practice tightening these muscles up 10 times a set and repeating this about 3 times per day. Once you know what muscles to tighten up, do not perform these exercises during urination. It is more likely to cause an infection by backing up the urine.  °· Ask for help if you have financial, counseling or nutritional needs during pregnancy. Your caregiver will be able to offer counseling for these needs as well as refer you for other special needs.  °· Make a list of emergency phone numbers and have them available.  °· Plan on getting help from family or friends when you go home from the hospital.  °· Make a trial run to the hospital.  °· Take prenatal classes with the father to understand, practice and ask questions about the labor and delivery.  °· Prepare the baby's room/nursery.  °· Do not travel out of the city unless it is absolutely necessary   and with the advice of your caregiver.   °· Wear only low or no heal shoes to have better balance and prevent falling.  °MEDICATIONS AND DRUG USE IN PREGNANCY °· Take prenatal vitamins as directed. The vitamin should contain 1 milligram of folic acid. Keep all vitamins out of reach of children. Only a couple vitamins or tablets containing iron may be fatal to a baby or young child when ingested.  °· Avoid use of all medications, including herbs, over-the-counter medications, not prescribed or suggested by your caregiver. Only take over-the-counter or prescription medicines for pain, discomfort, or fever as directed by your caregiver. Do not use aspirin, ibuprofen (Motrin®, Advil®, Nuprin®) or naproxen (Aleve®) unless OK'd by your caregiver.  °· Let your caregiver also know about herbs you may be using.  °· Alcohol is related to a number of birth defects. This includes fetal alcohol syndrome. All alcohol, in any form, should be avoided completely. Smoking will cause low birth rate and premature babies.  °· Street/illegal drugs are very harmful to the baby. They are absolutely forbidden. A baby born to an addicted mother will be addicted at birth. The baby will go through the same withdrawal an adult does.  °SEEK MEDICAL CARE IF: °You have any concerns or worries during your pregnancy. It is better to call with your questions if you feel they cannot wait, rather than worry about them. °DECISIONS ABOUT CIRCUMCISION °You may or may not know the sex of your baby. If you know your baby is a boy, it may be time to think about circumcision. Circumcision is the removal of the foreskin of the penis. This is the skin that covers the sensitive end of the penis. There is no proven medical need for this. Often this decision is made on what is popular at the time or based upon religious beliefs and social issues. You can discuss these issues with your caregiver or pediatrician. °SEEK IMMEDIATE MEDICAL CARE IF:  °· An unexplained oral temperature above 102° F (38.9° C)  develops, or as your caregiver suggests.  °· You have leaking of fluid from the vagina (birth canal). If leaking membranes are suspected, take your temperature and tell your caregiver of this when you call.  °· There is vaginal spotting, bleeding or passing clots. Tell your caregiver of the amount and how many pads are used.  °· You develop a bad smelling vaginal discharge with a change in the color from clear to white.  °· You develop vomiting that lasts more than 24 hours.  °· You develop chills or fever.  °· You develop shortness of breath.  °· You develop burning on urination.  °· You loose more than 2 pounds of weight or gain more than 2 pounds of weight or as suggested by your caregiver.  °· You notice sudden swelling of your face, hands, and feet or legs.  °· You develop belly (abdominal) pain. Round ligament discomfort is a common non-cancerous (benign) cause of abdominal pain in pregnancy. Your caregiver still must evaluate you.  °· You develop a severe headache that does not go away.  °· You develop visual problems, blurred or double vision.  °· If you have not felt your baby move for more than 1 hour. If you think the baby is not moving as much as usual, eat something with sugar in it and lie down on your left side for an hour. The baby should move at least 4 to 5 times per hour. Call right away   if your baby moves less than that.  °· You fall, are in a car accident or any kind of trauma.  °· There is mental or physical violence at home.  °Document Released: 07/28/2001 Document Revised: 04/15/2011 Document Reviewed: 01/30/2009 °ExitCare® Patient Information ©2012 [employer]. °

## 2011-08-18 LAB — OBSTETRIC PANEL
Antibody Screen: NEGATIVE
Basophils Absolute: 0 10*3/uL (ref 0.0–0.1)
Basophils Relative: 0 % (ref 0–1)
Eosinophils Absolute: 0.1 10*3/uL (ref 0.0–0.7)
Hemoglobin: 12.6 g/dL (ref 12.0–15.0)
Hepatitis B Surface Ag: NEGATIVE
MCH: 28.4 pg (ref 26.0–34.0)
MCHC: 34 g/dL (ref 30.0–36.0)
Monocytes Absolute: 0.6 10*3/uL (ref 0.1–1.0)
Monocytes Relative: 10 % (ref 3–12)
Neutro Abs: 3.5 10*3/uL (ref 1.7–7.7)
Neutrophils Relative %: 62 % (ref 43–77)
RDW: 16.1 % — ABNORMAL HIGH (ref 11.5–15.5)
Rh Type: POSITIVE

## 2011-08-19 ENCOUNTER — Telehealth: Payer: Self-pay | Admitting: *Deleted

## 2011-08-19 DIAGNOSIS — O2340 Unspecified infection of urinary tract in pregnancy, unspecified trimester: Secondary | ICD-10-CM

## 2011-08-19 DIAGNOSIS — O24419 Gestational diabetes mellitus in pregnancy, unspecified control: Secondary | ICD-10-CM

## 2011-08-19 DIAGNOSIS — O4700 False labor before 37 completed weeks of gestation, unspecified trimester: Secondary | ICD-10-CM

## 2011-08-19 DIAGNOSIS — O4410 Placenta previa with hemorrhage, unspecified trimester: Secondary | ICD-10-CM

## 2011-08-19 LAB — CULTURE, OB URINE: Colony Count: 10000

## 2011-08-19 NOTE — Telephone Encounter (Signed)
Pt called stating that she needs a statement faxed to where she stays, for social services.Marland Kitchen

## 2011-08-19 NOTE — Telephone Encounter (Signed)
Spoke w/pt. She states that she needs a letter faxed to [facility] stating that she cannot work because she is on bedrest and has placenta previa.  I told pt that I will have letter prepared and faxed. Pt will keep next appt.

## 2011-08-20 ENCOUNTER — Telehealth: Payer: Self-pay | Admitting: *Deleted

## 2011-08-20 ENCOUNTER — Encounter: Payer: Self-pay | Admitting: Obstetrics & Gynecology

## 2011-08-20 DIAGNOSIS — O24419 Gestational diabetes mellitus in pregnancy, unspecified control: Secondary | ICD-10-CM

## 2011-08-20 DIAGNOSIS — O2340 Unspecified infection of urinary tract in pregnancy, unspecified trimester: Secondary | ICD-10-CM

## 2011-08-20 DIAGNOSIS — O4410 Placenta previa with hemorrhage, unspecified trimester: Secondary | ICD-10-CM

## 2011-08-20 DIAGNOSIS — O4700 False labor before 37 completed weeks of gestation, unspecified trimester: Secondary | ICD-10-CM

## 2011-08-20 NOTE — Telephone Encounter (Signed)
Patient calls and states that she is bleeding and has been for a day. She went to [facility] and was told that they couldn't do anything for her since she was under our care. I advised patient that she come here and be seen in [facility]. Pt agrees and will come in today.

## 2011-08-25 ENCOUNTER — Inpatient Hospital Stay (HOSPITAL_COMMUNITY)
Admission: AD | Admit: 2011-08-25 | Discharge: 2011-09-02 | DRG: 765 | Disposition: A | Discharge status: Home / Self Care | Payer: Medicaid Other | Source: Ambulatory Visit | Attending: Family Medicine | Admitting: Family Medicine

## 2011-08-25 ENCOUNTER — Encounter (HOSPITAL_COMMUNITY): Payer: Self-pay | Admitting: Family Medicine

## 2011-08-25 DIAGNOSIS — O4410 Placenta previa with hemorrhage, unspecified trimester: Principal | ICD-10-CM | POA: Diagnosis present

## 2011-08-25 DIAGNOSIS — O99814 Abnormal glucose complicating childbirth: Secondary | ICD-10-CM | POA: Diagnosis present

## 2011-08-25 DIAGNOSIS — O4700 False labor before 37 completed weeks of gestation, unspecified trimester: Secondary | ICD-10-CM

## 2011-08-25 DIAGNOSIS — O24419 Gestational diabetes mellitus in pregnancy, unspecified control: Secondary | ICD-10-CM | POA: Diagnosis present

## 2011-08-25 LAB — CBC
MCH: 27.8 pg (ref 26.0–34.0)
MCHC: 33.4 g/dL (ref 30.0–36.0)
Platelets: 198 10*3/uL (ref 150–400)
RDW: 15 % (ref 11.5–15.5)

## 2011-08-25 MED ORDER — ACETAMINOPHEN 325 MG PO TABS: 650.0000 mg | ORAL_TABLET | ORAL | Status: DC | PRN

## 2011-08-25 MED ORDER — PRENATAL MULTIVITAMIN CH
1.0000 | ORAL_TABLET | Freq: Every day | ORAL | Status: DC
  Administered 2011-08-26 – 2011-08-29 (×3): 1 via ORAL
  Filled 2011-08-25 (×5): qty 1

## 2011-08-25 MED ORDER — LACTATED RINGERS IV SOLN
INTRAVENOUS | Status: DC
  Administered 2011-08-25 – 2011-08-26 (×2): via INTRAVENOUS
  Administered 2011-08-26: 125 mL/h via INTRAVENOUS
  Administered 2011-08-27 (×2): via INTRAVENOUS
  Administered 2011-08-27: 1000 mL via INTRAVENOUS
  Administered 2011-08-28 (×2): via INTRAVENOUS
  Administered 2011-08-29: 1000 mL via INTRAVENOUS
  Administered 2011-08-29 – 2011-08-30 (×2): via INTRAVENOUS

## 2011-08-25 MED ORDER — CALCIUM CARBONATE ANTACID 500 MG PO CHEW: 2.0000 | CHEWABLE_TABLET | ORAL | Status: DC | PRN

## 2011-08-25 MED ORDER — DOCUSATE SODIUM 100 MG PO CAPS
100.0000 mg | ORAL_CAPSULE | Freq: Every day | ORAL | Status: DC
  Administered 2011-08-26 – 2011-08-29 (×3): 100 mg via ORAL
  Filled 2011-08-25 (×5): qty 1

## 2011-08-25 MED ORDER — NIFEDIPINE ER 30 MG PO TB24
30.0000 mg | ORAL_TABLET | Freq: Two times a day (BID) | ORAL | Status: DC
  Administered 2011-08-25 – 2011-08-26 (×3): 30 mg via ORAL
  Filled 2011-08-25 (×3): qty 1

## 2011-08-25 MED ORDER — PANTOPRAZOLE SODIUM 40 MG PO TBEC
40.0000 mg | DELAYED_RELEASE_TABLET | Freq: Every day | ORAL | Status: DC
  Administered 2011-08-26 – 2011-08-29 (×4): 40 mg via ORAL
  Filled 2011-08-25 (×6): qty 1

## 2011-08-25 MED ORDER — ZOLPIDEM TARTRATE 10 MG PO TABS: 10.0000 mg | ORAL_TABLET | Freq: Every evening | ORAL | Status: DC | PRN

## 2011-08-25 NOTE — Progress Notes (Signed)
Received via stretcher from [facility] to [facility] in stable condition

## 2011-08-25 NOTE — H&P (Signed)
Kari Collier is an 21 y.o. G2P1001 [redacted]w[redacted]d female.   Chief Complaint:Vaginal bleeding HPI: Patient is a 21 y.o. G2P1001 @ [redacted]w[redacted]d with know placenta previa.  Previously admitted from 07/25/2011-08/10/2011 with bleeding.  Reports bleeding over last few days.  Became much heavier over last 5 hours where she began hemorrhaging and presented to [facility].  She was stabilized and transferred here.  She has been on home bedrest and Procardia and continues to have few contractions.  She has recently been diagnosed with GDM, following diet.  Reports last BS was 104.    Past Medical History  Diagnosis Date  . No pertinent past medical history     Past Surgical History  Procedure Date  . No past surgeries     Family History  Problem Relation Age of Onset  . Diabetes Father   . Hypertension Father    Social History:  reports that she has never smoked. She has never used smokeless tobacco. She reports that she does not drink alcohol or use illicit drugs.  Allergies: No Known Allergies  Medications Prior to Admission  Medication Dose Route Frequency Provider Last Rate Last Dose  . acetaminophen (TYLENOL) tablet 650 mg  650 mg Oral Q4H PRN Kari Bores, MD      . calcium carbonate (TUMS - dosed in mg elemental calcium) chewable tablet 400 mg of elemental calcium  2 tablet Oral Q4H PRN Kari Bores, MD      . docusate sodium (COLACE) capsule 100 mg  100 mg Oral Daily Kari Bores, MD      . lactated ringers infusion   Intravenous Continuous Kari Bores, MD      . prenatal multivitamin tablet 1 tablet  1 tablet Oral Daily Kari Bores, MD      . zolpidem (AMBIEN) tablet 10 mg  10 mg Oral QHS PRN Kari Bores, MD       Medications Prior to Admission  Medication Sig Dispense Refill  . Blood Glucose Monitoring Suppl (ONE TOUCH ULTRA SYSTEM KIT) W/DEVICE KIT 1 kit by Does not apply route once.  1 each  0  . cephALEXin (KEFLEX) 500 MG capsule Take 1 capsule (500 mg total) by mouth 4 (four) times  daily.  28 capsule  0  . glucose blood (ONE TOUCH ULTRA TEST) test strip Use as instructed  100 each  2  . NIFEdipine (PROCARDIA-XL/ADALAT CC) 30 MG 24 hr tablet Take 1 tablet (30 mg total) by mouth 2 (two) times daily.  60 tablet  0  . ONE TOUCH LANCETS MISC 1 applicator by Does not apply route QID. Take CBG fasting and 2 hrs post prandial  120 each  4  . pantoprazole (PROTONIX) 40 MG tablet Take 1 tablet (40 mg total) by mouth daily at 12 noon.  30 tablet  1  . Prenatal Vitamins (DIS) TABS Take 1 tablet by mouth daily.        Patient was not taking Keflex for presumed UTI and urine culture from that day was negative.   Pertinent items are noted in HPI.  Blood pressure 116/64, pulse 90, temperature 98.5 F (36.9 C), temperature source Oral, resp. rate 18, height 5\' 5"  (1.651 m), weight 66.679 kg (147 lb). BP 116/64  Pulse 90  Temp(Src) 98.5 F (36.9 C) (Oral)  Resp 18  Ht 5\' 5"  (1.651 m)  Wt 66.679 kg (147 lb)  BMI 24.46 kg/m2 General appearance: alert, cooperative and appears stated age Eyes: sclera without icterus  Neck: supple, symmetrical, trachea midline Lungs: clear to auscultation bilaterally Heart: regular rate and rhythm, S1, S2 normal, no murmur, click, rub or gallop Abdomen: soft, non-tender; bowel sounds normal; no masses,  no organomegaly Pelvic: deferred due to previa Extremities: extremities normal, atraumatic, no cyanosis or edema Pulses: 2+ and symmetric Skin: Skin color, texture, turgor normal. No rashes or lesions Neurologic: Grossly normal   Lab Results  Component Value Date   WBC 5.7 08/17/2011   HGB 12.6 08/17/2011   HCT 37.1 08/17/2011   MCV 83.6 08/17/2011   PLT 216 08/17/2011   No results found for this basename: PREGTESTUR, PREGSERUM, HCG, HCGQUANT     Assessment/Plan Patient Active Problem List  Diagnoses  . Placenta previa with hemorrhage, antepartum  . Preterm uterine contractions, antepartum  . Gestational diabetes  . Supervision of  high-risk pregnancy   Hospitalization for remainder of pregnancy. Type and Screen q 3 days. CBC Bedrest Continue procardia GDM diet and check BS  Kari Collier S 08/25/2011, 11:10 PM

## 2011-08-26 ENCOUNTER — Inpatient Hospital Stay (HOSPITAL_COMMUNITY): Payer: Medicaid Other

## 2011-08-26 DIAGNOSIS — O4410 Placenta previa with hemorrhage, unspecified trimester: Secondary | ICD-10-CM

## 2011-08-26 LAB — TYPE AND SCREEN
ABO/RH(D): O POS
Unit division: 0

## 2011-08-26 LAB — GLUCOSE, CAPILLARY: Glucose-Capillary: 88 mg/dL (ref 70–99)

## 2011-08-26 LAB — CBC
MCHC: 33.7 g/dL (ref 30.0–36.0)
MCV: 83.2 fL (ref 78.0–100.0)
Platelets: 200 10*3/uL (ref 150–400)
RDW: 15 % (ref 11.5–15.5)
WBC: 6.9 10*3/uL (ref 4.0–10.5)

## 2011-08-26 LAB — RPR: RPR Ser Ql: NONREACTIVE

## 2011-08-26 MED ORDER — TERBUTALINE SULFATE 1 MG/ML IJ SOLN
0.2500 mg | Freq: Once | INTRAMUSCULAR | Status: AC
  Administered 2011-08-26: 0.25 mg via SUBCUTANEOUS
  Filled 2011-08-26: qty 1

## 2011-08-26 MED ORDER — NIFEDIPINE ER 30 MG PO TB24
30.0000 mg | ORAL_TABLET | ORAL | Status: AC
  Administered 2011-08-26: 30 mg via ORAL
  Filled 2011-08-26: qty 1

## 2011-08-26 MED ORDER — NIFEDIPINE ER 60 MG PO TB24
60.0000 mg | ORAL_TABLET | Freq: Two times a day (BID) | ORAL | Status: DC
  Administered 2011-08-27: 60 mg via ORAL
  Filled 2011-08-26 (×3): qty 1

## 2011-08-26 NOTE — Progress Notes (Signed)
Patient noticed sudden bright red bleeding, Pad is assessed, large amount of blood noted on pad, Dr Debroah Loop notified, Vital signs WNL.

## 2011-08-26 NOTE — Progress Notes (Signed)
Admitted at  [redacted] weeks gestation,  with placenta previa, GDM  Height  65 " Weight 147 Lbs  pre-pregnancy weight approx 130 Lbs.Pre-pregnancy  BMI 21.6   IBW 125 Lbs  Total weight gain 17 Lbs. Weight gain goals 25-35 Lbs.   Estimated needs: 18-2000 kcal/day, 65-75 g grams protein/day, 2 liters fluid/day Carbohydrate modified gestational diet tolerated well, appetite good.  Current diet prescription will provide for increased needs due to pregnancy. Patient is requesting double meat portions, has increased appetite and no weight gain since last admission No abnormal nutrition related labs. CBG's wnl  Nutrition Dx: Increased nutrient needs r/t pregnancy and fetal growth requirements aeb [redacted] weeks gestation.  No educational needs assessed at this time. Diet education completed last admission

## 2011-08-26 NOTE — Progress Notes (Signed)
Ur chart review completed.  

## 2011-08-26 NOTE — Progress Notes (Signed)
Patient ID: [name], female   DOB: 1991/05/23, 21 y.o.   MRN: [id number] FACULTY PRACTICE ANTEPARTUM(COMPREHENSIVE) NOTE  Angelis Gates is a 21 y.o. G2P1001 at [redacted]w[redacted]d by early ultrasound who is admitted for vaginal bleeding with placenta previa.   Fetal presentation is cephalic. Length of Stay:  1  Days  Subjective: Dark  Red bleeding started 10-15 min ago, cramps Patient reports the fetal movement as active. Patient reports uterine contraction  activity as irregular. Patient reports  vaginal bleeding as flow about like a period. Patient describes fluid per vagina as None.  Vitals:  Blood pressure 123/55, pulse 111, temperature 98.8 F (37.1 C), temperature source Oral, resp. rate 18, height 5\' 5"  (1.651 m), weight 66.679 kg (147 lb). Physical Examination:  General appearance - alert, well appearing, and in no distress Heart - normal rate and regular rhythm Abdomen - soft, nontender, nondistended Fundal Height:  size equals dates Cervical Exam: Not evaluated. and found to be not evaluated/ not done/not assessed and fetal presentation is cephalic. Extremities: extremities normal, atraumatic, no cyanosis or edema and Homans sign is negative, no sign of DVT with DTRs 2+ bilaterally Membranes:intact  Fetal Monitoring:  Baseline: 140 bpm, Variability: Fair (1-6 bpm) and Accelerations: Reactive  Labs:  Recent Results (from the past 24 hour(s))  CBC   Collection Time   08/25/11 11:11 PM      Component Value Range   WBC 6.4  4.0 - 10.5 (K/uL)   RBC 3.81 (*) 3.87 - 5.11 (MIL/uL)   Hemoglobin 10.6 (*) 12.0 - 15.0 (g/dL)   HCT 24.4 (*) 01.0 - 46.0 (%)   MCV 83.2  78.0 - 100.0 (fL)   MCH 27.8  26.0 - 34.0 (pg)   MCHC 33.4  30.0 - 36.0 (g/dL)   RDW 27.2  53.6 - 64.4 (%)   Platelets 198  150 - 400 (K/uL)  RPR   Collection Time   08/25/11 11:11 PM      Component Value Range   RPR NON REACTIVE  NON REACTIVE   TYPE AND SCREEN   Collection Time   08/25/11 11:31 PM      Component  Value Range   ABO/RH(D) O POS     Antibody Screen NEG     Sample Expiration 08/28/2011    GLUCOSE, CAPILLARY   Collection Time   08/26/11  4:57 AM      Component Value Range   Glucose-Capillary 94  70 - 99 (mg/dL)  GLUCOSE, CAPILLARY   Collection Time   08/26/11 11:40 AM      Component Value Range   Glucose-Capillary 71  70 - 99 (mg/dL)   Comment 1 Notify RN     Comment 2 Documented in Chart      Imaging Studies:     Currently EPIC will not allow sonographic studies to automatically populate into notes.  In the meantime, copy and paste results into note or free text.  Medications:  Scheduled    . docusate sodium  100 mg Oral Daily  . NIFEdipine  30 mg Oral BID  . pantoprazole  40 mg Oral Q1200  . prenatal multivitamin  1 tablet Oral Daily   Scheduled:   . docusate sodium  100 mg Oral Daily  . NIFEdipine  30 mg Oral BID  . pantoprazole  40 mg Oral Q1200  . prenatal multivitamin  1 tablet Oral Daily  NAD Abd gravid not tender, dark blood on pad, moderate  ASSESSMENT: Patient Active Problem List  Diagnoses  .  Placenta previa with hemorrhage, antepartum  . Preterm uterine contractions, antepartum  . Gestational diabetes  . Supervision of high-risk pregnancy    PLAN: Observe for continued bleeding, possible need to perform cesarean delivery. Pt counseled procedure, risks bleeding, anesthesia, pain, bowel, bladder complication, infection. NPO for now , consent signed  ARNOLD,JAMES 08/26/2011,4:48 PM

## 2011-08-26 NOTE — Progress Notes (Signed)
Patient ID: Kari Collier, female   DOB: [birth year], 21 y.o.   MRN: [id number] FACULTY PRACTICE ANTEPARTUM(COMPREHENSIVE) NOTE  Kari Collier is a 21 y.o. G2P1001 at [redacted]w[redacted]d by midtrimester ultrasound who is admitted for bleeding previa.   Fetal presentation is unsure. Length of Stay:  1  Days  Subjective: Bleeding has decreased. Patient reports the fetal movement as active. Patient reports uterine contraction  activity as irregular, every 10 minutes. Patient reports  vaginal bleeding as spotting. Patient describes fluid per vagina as None.  Vitals:  Blood pressure 105/59, pulse 89, temperature 98.5 F (36.9 C), temperature source Oral, resp. rate 18, height 5\' 5"  (1.651 m), weight 66.679 kg (147 lb). Physical Examination:  General appearance - alert, well appearing, and in no distress Abdomen - soft, nontender, nondistended, no masses or organomegaly gravid Fundal Height:  size equals dates Pelvic Exam:  Deferred due to previa. Extremities: extremities normal, atraumatic, no cyanosis or edema  Membranes:intact  Fetal Monitoring:  Baseline: 150 bpm, Variability: Good {> 6 bpm) and Accelerations: Reactive  Labs:  Recent Results (from the past 24 hour(s))  CBC   Collection Time   08/25/11 11:11 PM      Component Value Range   WBC 6.4  4.0 - 10.5 (K/uL)   RBC 3.81 (*) 3.87 - 5.11 (MIL/uL)   Hemoglobin 10.6 (*) 12.0 - 15.0 (g/dL)   HCT 69.6 (*) 29.5 - 46.0 (%)   MCV 83.2  78.0 - 100.0 (fL)   MCH 27.8  26.0 - 34.0 (pg)   MCHC 33.4  30.0 - 36.0 (g/dL)   RDW 28.4  13.2 - 44.0 (%)   Platelets 198  150 - 400 (K/uL)  RPR   Collection Time   08/25/11 11:11 PM      Component Value Range   RPR NON REACTIVE  NON REACTIVE   TYPE AND SCREEN   Collection Time   08/25/11 11:31 PM      Component Value Range   ABO/RH(D) O POS     Antibody Screen NEG     Sample Expiration 08/28/2011    GLUCOSE, CAPILLARY   Collection Time   08/26/11  4:57 AM      Component Value Range   Glucose-Capillary 94  70 - 99 (mg/dL)    Imaging Studies:    Pending this am.  Medications:  Scheduled    . docusate sodium  100 mg Oral Daily  . NIFEdipine  30 mg Oral BID  . pantoprazole  40 mg Oral Q1200  . prenatal multivitamin  1 tablet Oral Daily   I have reviewed the patient's current medications.  ASSESSMENT: Patient Active Problem List  Diagnoses  . Placenta previa with hemorrhage, antepartum  . Preterm uterine contractions, antepartum  . Gestational diabetes  . Supervision of high-risk pregnancy    PLAN: Continue inpatient management.  U/s today  Kari Collier S 08/26/2011,7:41 AM

## 2011-08-27 LAB — GLUCOSE, CAPILLARY
Glucose-Capillary: 94 mg/dL (ref 70–99)
Glucose-Capillary: 97 mg/dL (ref 70–99)

## 2011-08-27 MED ORDER — MAGNESIUM SULFATE BOLUS VIA INFUSION
4.0000 g | Freq: Once | INTRAVENOUS | Status: AC
  Administered 2011-08-27: 4 g via INTRAVENOUS
  Filled 2011-08-27: qty 500

## 2011-08-27 MED ORDER — MAGNESIUM SULFATE 40 G IN LACTATED RINGERS - SIMPLE
2.0000 g/h | INTRAVENOUS | Status: DC
  Administered 2011-08-28: 2 g/h via INTRAVENOUS
  Filled 2011-08-27 (×2): qty 500

## 2011-08-27 NOTE — Progress Notes (Signed)
FACULTY PRACTICE ANTEPARTUM NOTE  Kari Collier is a 21 y.o. G2P1001 at [redacted]w[redacted]d  who is admitted for vaginal bleeding in setting of complete placenta previa.   Fetal presentation is cephalic. Length of Stay:  2  Days  Subjective:  Patient reports good fetal movement.  She reports uterine contractions last night.  Received increased dose of procardia and terbutaline and has not had any contractions since.  Has dime sized spot of old blood on pad this AM.  No bleeding currently and no loss of fluid per vagina.  Vitals:  Blood pressure 95/49, pulse 103, temperature 98.4 F (36.9 C), temperature source Oral, resp. rate 18, height 5\' 5"  (1.651 m), weight 67.813 kg (149 lb 8 oz), SpO2 100.00%. Physical Examination:  General appearance - alert, well appearing, and in no distress Chest - clear to auscultation, no wheezes, rales or rhonchi, symmetric air entry Heart - normal rate, regular rhythm, normal S1, S2, no murmurs, rubs, clicks or gallops Fundal Height:  size equals dates Extremities: extremities normal, atraumatic, no cyanosis or edema   Fetal Monitoring:  Baseline: 120s bpm, Variability: Good {> 6 bpm), Accelerations: Reactive and Decelerations: Absent  Labs:  Recent Results (from the past 24 hour(s))  GLUCOSE, CAPILLARY   Collection Time   08/26/11 11:40 AM      Component Value Range   Glucose-Capillary 71  70 - 99 (mg/dL)   Comment 1 Notify RN     Comment 2 Documented in Chart    GLUCOSE, CAPILLARY   Collection Time   08/26/11  5:07 PM      Component Value Range   Glucose-Capillary 88  70 - 99 (mg/dL)   Comment 1 Notify RN     Comment 2 Documented in Chart    CBC   Collection Time   08/26/11  5:30 PM      Component Value Range   WBC 6.9  4.0 - 10.5 (K/uL)   RBC 3.64 (*) 3.87 - 5.11 (MIL/uL)   Hemoglobin 10.2 (*) 12.0 - 15.0 (g/dL)   HCT 21.3 (*) 08.6 - 46.0 (%)   MCV 83.2  78.0 - 100.0 (fL)   MCH 28.0  26.0 - 34.0 (pg)   MCHC 33.7  30.0 - 36.0 (g/dL)   RDW 57.8  46.9 -  62.9 (%)   Platelets 200  150 - 400 (K/uL)    Medications:  Scheduled    . docusate sodium  100 mg Oral Daily  . NIFEdipine  30 mg Oral NOW  . NIFEdipine  60 mg Oral BID  . pantoprazole  40 mg Oral Q1200  . prenatal multivitamin  1 tablet Oral Daily  . terbutaline  0.25 mg Subcutaneous Once  . DISCONTD: NIFEdipine  30 mg Oral BID   I have reviewed the patient's current medications.  ASSESSMENT: Patient Active Problem List  Diagnoses  . Placenta previa with hemorrhage, antepartum  . Preterm uterine contractions, antepartum  . Gestational diabetes  . Supervision of high-risk pregnancy    PLAN: Continue with Procardia XL 60mg . Continuous monitoring for now.   Continue routine antenatal care. CBGs in good range - continue diet control.   Kari Collier 08/27/2011,6:21 AM

## 2011-08-28 LAB — GLUCOSE, CAPILLARY: Glucose-Capillary: 86 mg/dL (ref 70–99)

## 2011-08-28 NOTE — Progress Notes (Signed)
Patient ID: [name], female   DOB: 1990-12-11, 21 y.o.   MRN: [id number]  FACULTY PRACTICE ANTEPARTUM(COMPREHENSIVE) NOTE  Tenesha Garza is a 21 y.o. G2P1001 at [redacted]w[redacted]d  who is admitted for  Bleeding from placenta prevai.   Length of Stay:  3  Days  Subjective: Pt feels less contractions on Magnesium Sulfate. Patient reports the fetal movement as active. Patient reports uterine contraction  activity as none. Patient reports  vaginal bleeding as spotting. Patient describes fluid per vagina as None.  Vitals:  Blood pressure 102/58, pulse 83, temperature 98.2 F (36.8 C), temperature source Oral, resp. rate 20, height 5\' 5"  (1.651 m), weight 67.813 kg (149 lb 8 oz), SpO2 100.00%. Physical Examination:  General appearance - alert, well appearing, and in no distress Mental status - affect appropriate to mood Abdomen - soft, nontender, nondistended, no masses or organomegaly Extremities - Homan's sign negative bilaterally Chest - CTA B  Fetal Monitoring:  Baseline: 120 bpm, Variability: Good {> 6 bpm), Accelerations: Reactive and Decelerations: Absent  Labs:  Recent Results (from the past 24 hour(s))  GLUCOSE, CAPILLARY   Collection Time   08/27/11  4:38 PM      Component Value Range   Glucose-Capillary 94  70 - 99 (mg/dL)   Comment 1 Notify RN     Comment 2 Documented in Chart    PREPARE RBC (CROSSMATCH)   Collection Time   08/27/11  6:00 PM      Component Value Range   Order Confirmation ORDER PROCESSED BY BLOOD BANK    GLUCOSE, CAPILLARY   Collection Time   08/27/11 10:56 PM      Component Value Range   Glucose-Capillary 97  70 - 99 (mg/dL)   Comment 1 Notify RN    GLUCOSE, CAPILLARY   Collection Time   08/28/11  6:03 AM      Component Value Range   Glucose-Capillary 74  70 - 99 (mg/dL)    Imaging Studies:    n/a  Medications:  Scheduled    . docusate sodium  100 mg Oral Daily  . magnesium  4 g Intravenous Once  . pantoprazole  40 mg Oral Q1200  .  prenatal multivitamin  1 tablet Oral Daily  . DISCONTD: NIFEdipine  60 mg Oral BID   I have reviewed the patient's current medications.  ASSESSMENT: Patient Active Problem List  Diagnoses  . Placenta previa with hemorrhage, antepartum  . Preterm uterine contractions, antepartum  . Gestational diabetes  . Supervision of high-risk pregnancy    PLAN: Pt seems to have less contraction on magnesium sulfate.  Will continue this for now. CBGs well controlled with diet. Continue SCDs for DVT prophylaxis.  Laurren Lepkowski H. 08/28/2011,6:59 AM

## 2011-08-29 ENCOUNTER — Encounter (HOSPITAL_COMMUNITY): Payer: Self-pay | Admitting: *Deleted

## 2011-08-29 ENCOUNTER — Inpatient Hospital Stay (HOSPITAL_COMMUNITY): Payer: Medicaid Other | Admitting: Anesthesiology

## 2011-08-29 ENCOUNTER — Other Ambulatory Visit: Payer: Self-pay | Admitting: Obstetrics & Gynecology

## 2011-08-29 ENCOUNTER — Encounter (HOSPITAL_COMMUNITY)
Admission: AD | Disposition: A | Discharge status: Home / Self Care | Payer: Self-pay | Source: Ambulatory Visit | Attending: Family Medicine

## 2011-08-29 ENCOUNTER — Encounter (HOSPITAL_COMMUNITY): Payer: Self-pay | Admitting: Anesthesiology

## 2011-08-29 DIAGNOSIS — O4410 Placenta previa with hemorrhage, unspecified trimester: Secondary | ICD-10-CM

## 2011-08-29 DIAGNOSIS — O99814 Abnormal glucose complicating childbirth: Secondary | ICD-10-CM

## 2011-08-29 LAB — TYPE AND SCREEN: Antibody Screen: NEGATIVE

## 2011-08-29 LAB — GLUCOSE, CAPILLARY: Glucose-Capillary: 65 mg/dL — ABNORMAL LOW (ref 70–99)

## 2011-08-29 SURGERY — Surgical Case
Anesthesia: Spinal | Site: Abdomen | Wound class: Clean Contaminated

## 2011-08-29 MED ORDER — DIPHENHYDRAMINE HCL 50 MG/ML IJ SOLN: 25.0000 mg | INTRAMUSCULAR | Status: DC | PRN

## 2011-08-29 MED ORDER — SCOPOLAMINE 1 MG/3DAYS TD PT72
1.0000 | MEDICATED_PATCH | Freq: Once | TRANSDERMAL | Status: DC
  Administered 2011-08-29: 1.5 mg via TRANSDERMAL

## 2011-08-29 MED ORDER — PHENYLEPHRINE HCL 10 MG/ML IJ SOLN
INTRAMUSCULAR | Status: DC | PRN
  Administered 2011-08-29: 120 ug via INTRAVENOUS

## 2011-08-29 MED ORDER — DIPHENHYDRAMINE HCL 50 MG/ML IJ SOLN
INTRAMUSCULAR | Status: AC
  Administered 2011-08-29: 12.5 mg via INTRAVENOUS
  Filled 2011-08-29: qty 1

## 2011-08-29 MED ORDER — PROMETHAZINE HCL 25 MG/ML IJ SOLN: 6.2500 mg | INTRAMUSCULAR | Status: DC | PRN

## 2011-08-29 MED ORDER — MORPHINE SULFATE 0.5 MG/ML IJ SOLN
INTRAMUSCULAR | Status: AC
  Filled 2011-08-29: qty 10

## 2011-08-29 MED ORDER — PHENYLEPHRINE 40 MCG/ML (10ML) SYRINGE FOR IV PUSH (FOR BLOOD PRESSURE SUPPORT)
PREFILLED_SYRINGE | INTRAVENOUS | Status: AC
  Filled 2011-08-29: qty 10

## 2011-08-29 MED ORDER — OXYTOCIN 20 UNITS IN LACTATED RINGERS INFUSION - SIMPLE
INTRAVENOUS | Status: AC
  Administered 2011-08-29: 125 mL/h via INTRAVENOUS
  Filled 2011-08-29: qty 1000

## 2011-08-29 MED ORDER — OXYTOCIN 10 UNIT/ML IJ SOLN
INTRAMUSCULAR | Status: AC
  Filled 2011-08-29: qty 4

## 2011-08-29 MED ORDER — MORPHINE SULFATE (PF) 0.5 MG/ML IJ SOLN
INTRAMUSCULAR | Status: DC | PRN
  Administered 2011-08-29: .1 mg via INTRATHECAL

## 2011-08-29 MED ORDER — SCOPOLAMINE 1 MG/3DAYS TD PT72
MEDICATED_PATCH | TRANSDERMAL | Status: AC
  Administered 2011-08-29: 1.5 mg via TRANSDERMAL
  Filled 2011-08-29: qty 1

## 2011-08-29 MED ORDER — FENTANYL CITRATE 0.05 MG/ML IJ SOLN
INTRAMUSCULAR | Status: DC | PRN
  Administered 2011-08-29: 25 ug via INTRATHECAL

## 2011-08-29 MED ORDER — OXYTOCIN 10 UNIT/ML IJ SOLN
40.0000 [IU] | INTRAVENOUS | Status: DC | PRN
  Administered 2011-08-29: 40 [IU] via INTRAVENOUS

## 2011-08-29 MED ORDER — FAMOTIDINE IN NACL 20-0.9 MG/50ML-% IV SOLN
20.0000 mg | Freq: Once | INTRAVENOUS | Status: DC
  Filled 2011-08-29: qty 50

## 2011-08-29 MED ORDER — CEFAZOLIN SODIUM 1-5 GM-% IV SOLN
INTRAVENOUS | Status: DC | PRN
  Administered 2011-08-29: 1 g via INTRAVENOUS

## 2011-08-29 MED ORDER — EPHEDRINE SULFATE 50 MG/ML IJ SOLN
INTRAMUSCULAR | Status: DC | PRN
  Administered 2011-08-29 (×2): 10 mg via INTRAVENOUS

## 2011-08-29 MED ORDER — MEPERIDINE HCL 25 MG/ML IJ SOLN: 6.2500 mg | INTRAMUSCULAR | Status: DC | PRN

## 2011-08-29 MED ORDER — OXYTOCIN 20 UNITS IN LACTATED RINGERS INFUSION - SIMPLE
125.0000 mL/h | INTRAVENOUS | Status: AC
  Administered 2011-08-29: 125 mL/h via INTRAVENOUS

## 2011-08-29 MED ORDER — BUPIVACAINE HCL (PF) 0.5 % IJ SOLN
INTRAMUSCULAR | Status: DC | PRN
  Administered 2011-08-29: 30 mL

## 2011-08-29 MED ORDER — 0.9 % SODIUM CHLORIDE (POUR BTL) OPTIME
TOPICAL | Status: DC | PRN
  Administered 2011-08-29: 1000 mL

## 2011-08-29 MED ORDER — DIPHENHYDRAMINE HCL 25 MG PO CAPS
25.0000 mg | ORAL_CAPSULE | ORAL | Status: DC | PRN
  Filled 2011-08-29: qty 1

## 2011-08-29 MED ORDER — FENTANYL CITRATE 0.05 MG/ML IJ SOLN
INTRAMUSCULAR | Status: AC
  Filled 2011-08-29: qty 2

## 2011-08-29 MED ORDER — CITRIC ACID-SODIUM CITRATE 334-500 MG/5ML PO SOLN
ORAL | Status: AC
  Administered 2011-08-29: 30 mL
  Filled 2011-08-29: qty 15

## 2011-08-29 MED ORDER — LACTATED RINGERS IV SOLN
INTRAVENOUS | Status: DC | PRN
  Administered 2011-08-29: 22:00:00 via INTRAVENOUS

## 2011-08-29 MED ORDER — FENTANYL CITRATE 0.05 MG/ML IJ SOLN: 25.0000 ug | INTRAMUSCULAR | Status: DC | PRN

## 2011-08-29 MED ORDER — KETOROLAC TROMETHAMINE 30 MG/ML IJ SOLN
INTRAMUSCULAR | Status: AC
  Filled 2011-08-29: qty 1

## 2011-08-29 MED ORDER — NIFEDIPINE ER 30 MG PO TB24
30.0000 mg | ORAL_TABLET | Freq: Two times a day (BID) | ORAL | Status: DC
  Administered 2011-08-29 (×2): 30 mg via ORAL
  Filled 2011-08-29 (×3): qty 1

## 2011-08-29 MED ORDER — DIPHENHYDRAMINE HCL 50 MG/ML IJ SOLN
12.5000 mg | INTRAMUSCULAR | Status: DC | PRN
  Administered 2011-08-29: 12.5 mg via INTRAVENOUS

## 2011-08-29 MED ORDER — ONDANSETRON HCL 4 MG/2ML IJ SOLN
INTRAMUSCULAR | Status: AC
  Filled 2011-08-29: qty 2

## 2011-08-29 MED ORDER — CITRIC ACID-SODIUM CITRATE 334-500 MG/5ML PO SOLN
ORAL | Status: AC
  Filled 2011-08-29: qty 15

## 2011-08-29 MED ORDER — ACETAMINOPHEN 325 MG PO TABS: 325.0000 mg | ORAL_TABLET | ORAL | Status: DC | PRN

## 2011-08-29 MED ORDER — KETOROLAC TROMETHAMINE 30 MG/ML IJ SOLN
15.0000 mg | Freq: Once | INTRAMUSCULAR | Status: AC | PRN
  Administered 2011-08-29: 30 mg via INTRAVENOUS

## 2011-08-29 MED ORDER — BUPIVACAINE IN DEXTROSE 0.75-8.25 % IT SOLN
INTRATHECAL | Status: DC | PRN
  Administered 2011-08-29: 1.5 mL via INTRATHECAL

## 2011-08-29 SURGICAL SUPPLY — 32 items
CHLORAPREP W/TINT 26ML (MISCELLANEOUS) IMPLANT
CLOTH BEACON ORANGE TIMEOUT ST (SAFETY) ×2 IMPLANT
CONTAINER PREFILL 10% NBF 15ML (MISCELLANEOUS) IMPLANT
DRESSING TELFA 8X3 (GAUZE/BANDAGES/DRESSINGS) ×2 IMPLANT
ELECT REM PT RETURN 9FT ADLT (ELECTROSURGICAL) ×2
ELECTRODE REM PT RTRN 9FT ADLT (ELECTROSURGICAL) ×1 IMPLANT
GAUZE SPONGE 4X4 12PLY STRL LF (GAUZE/BANDAGES/DRESSINGS) IMPLANT
GLOVE BIO SURGEON STRL SZ 6.5 (GLOVE) ×4 IMPLANT
GOWN PREVENTION PLUS LG XLONG (DISPOSABLE) ×6 IMPLANT
KIT ABG SYR 3ML LUER SLIP (SYRINGE) ×2 IMPLANT
NEEDLE HYPO 25X5/8 SAFETYGLIDE (NEEDLE) ×2 IMPLANT
NEEDLE SPNL 18GX3.5 QUINCKE PK (NEEDLE) ×2 IMPLANT
NS IRRIG 1000ML POUR BTL (IV SOLUTION) ×2 IMPLANT
PACK C SECTION WH (CUSTOM PROCEDURE TRAY) ×2 IMPLANT
PAD ABD 7.5X8 STRL (GAUZE/BANDAGES/DRESSINGS) IMPLANT
SLEEVE SCD COMPRESS KNEE MED (MISCELLANEOUS) IMPLANT
STRIP CLOSURE SKIN 1/2X4 (GAUZE/BANDAGES/DRESSINGS) ×2 IMPLANT
SUT PDS AB 0 CTX 60 (SUTURE) IMPLANT
SUT VIC AB 0 CT1 27 (SUTURE)
SUT VIC AB 0 CT1 27XBRD ANBCTR (SUTURE) IMPLANT
SUT VIC AB 0 CT1 36 (SUTURE) IMPLANT
SUT VIC AB 2-0 CT1 27 (SUTURE) ×1
SUT VIC AB 2-0 CT1 TAPERPNT 27 (SUTURE) ×1 IMPLANT
SUT VIC AB 2-0 CTX 36 (SUTURE) ×4 IMPLANT
SUT VIC AB 3-0 CT1 27 (SUTURE) ×1
SUT VIC AB 3-0 CT1 TAPERPNT 27 (SUTURE) ×1 IMPLANT
SUT VIC AB 3-0 SH 27 (SUTURE)
SUT VIC AB 3-0 SH 27X BRD (SUTURE) IMPLANT
SYR 30ML LL (SYRINGE) ×2 IMPLANT
TOWEL OR 17X24 6PK STRL BLUE (TOWEL DISPOSABLE) ×4 IMPLANT
TRAY FOLEY CATH 14FR (SET/KITS/TRAYS/PACK) ×2 IMPLANT
WATER STERILE IRR 1000ML POUR (IV SOLUTION) ×2 IMPLANT

## 2011-08-29 NOTE — Progress Notes (Signed)
Patient ID: Kari Collier, female   DOB: Dec 22, 1990, 20 y.o.   MRN: [id number]   I was called to evaluate heavy vaginal bleeding.  She got up to use the bedside commode but instead saw about 500 cc of bright red blood. The towels were also heavy with blood. FHR reassuring. I have recommended that we procede with a cesarean section. Risks discussed. NICU, anesthesia and the OR have been made aware.

## 2011-08-29 NOTE — Anesthesia Preprocedure Evaluation (Signed)
Anesthesia Evaluation  Patient identified by MRN, date of birth, ID band Patient awake    Reviewed: Allergy & Precautions, H&P , NPO status , Patient's Chart, lab work & pertinent test results  Airway Mallampati: II      Dental No notable dental hx.    Pulmonary neg pulmonary ROS,  clear to auscultation  Pulmonary exam normal       Cardiovascular Exercise Tolerance: Good neg cardio ROS regular Normal    Neuro/Psych Negative Neurological ROS  Negative Psych ROS   GI/Hepatic negative GI ROS, Neg liver ROS,   Endo/Other  Negative Endocrine ROS  Renal/GU negative Renal ROS  Genitourinary negative   Musculoskeletal   Abdominal Normal abdominal exam  (+)   Peds  Hematology negative hematology ROS (+)   Anesthesia Other Findings Bleeding previa  Reproductive/Obstetrics (+) Pregnancy                           Anesthesia Physical Anesthesia Plan  ASA: III  Anesthesia Plan: Spinal   Post-op Pain Management:    Induction:   Airway Management Planned:   Additional Equipment:   Intra-op Plan:   Post-operative Plan:   Informed Consent: I have reviewed the patients History and Physical, chart, labs and discussed the procedure including the risks, benefits and alternatives for the proposed anesthesia with the patient or authorized representative who has indicated his/her understanding and acceptance.     Plan Discussed with: Anesthesiologist, CRNA and Surgeon  Anesthesia Plan Comments:         Anesthesia Quick Evaluation

## 2011-08-29 NOTE — Anesthesia Procedure Notes (Signed)
Spinal  Patient location during procedure: OR Start time: 08/29/2011 10:35 PM Staffing Anesthesiologist: Brayton Caves R Performed by: anesthesiologist  Preanesthetic Checklist Completed: patient identified, site marked, surgical consent, pre-op evaluation, timeout performed, IV checked, risks and benefits discussed and monitors and equipment checked Spinal Block Patient position: sitting Prep: DuraPrep Patient monitoring: heart rate, cardiac monitor, continuous pulse ox and blood pressure Approach: midline Location: L3-4 Injection technique: single-shot Needle Needle type: Sprotte  Needle gauge: 24 G Needle length: 9 cm Assessment Sensory level: T4 Additional Notes Patient identified.  Risk benefits discussed including failed block, incomplete pain control, headache, nerve damage, paralysis, blood pressure changes, nausea, vomiting, reactions to medication both toxic or allergic, and postpartum back pain.  Patient expressed understanding and wished to proceed.  All questions were answered.  Sterile technique used throughout procedure.  CSF was clear.  No parasthesia or other complications.  Please see nursing notes for vital signs.

## 2011-08-29 NOTE — Op Note (Signed)
08/25/2011 - 08/29/2011  10:53 PM  PATIENT:  Kari Collier  20 y.o. female  PRE-OPERATIVE DIAGNOSIS:  34 4/[redacted] weeks EGA, Placenta previa, heavy bleeding  POST-OPERATIVE DIAGNOSIS:  same  PROCEDURE:  Procedure(s): PRIMARY LOW TRANSVERSE CESAREAN SECTION  SURGEON:  Surgeon(s): Jari Carollo C. Marice Potter, MD  PHYSICIAN ASSISTANT:   ASSISTANTS: none   ANESTHESIA:   spinal  EBL:     BLOOD ADMINISTERED:none  DRAINS: none   LOCAL MEDICATIONS USED:  MARCAINE 30CC  SPECIMEN:  Source of Specimen:  cord blood, placenta  DISPOSITION OF SPECIMEN:  PATHOLOGY  COUNTS:  YES  TOURNIQUET:  * No tourniquets in log *  DICTATION: .Dragon Dictation  PLAN OF CARE: Admit to inpatient   PATIENT DISPOSITION:  PACU - hemodynamically stable.   Delay start of Pharmacological VTE agent (>24hrs) due to surgical blood loss or risk of bleeding: no  Darrin Apodaca C. Marice Potter, MD Physician Signed Op Note 02/27/2011 3:34 AM    The risks benefits and alternatives of surgery were explained, understood, and accepted. Consents were signed. All questions were answered. In the operating room spinal anesthesia was given without complications. Her abdomen and vagina were prepped and draped in the usual sterile fashion. A Foley catheter was placed, which drained clear urine throughout the case. After adequate anesthesia was assured, a transverse incision was made approximately 2 cm above the symphasis pubis. The incision was carried down to the scant amount of subcutaneous tissue to the fascia. Fascia was scored the midline and fascial incision was extended bilaterally. The middle 20% of the rectus muscles were separated in transverse fashion using electrosurgical technique. Excellent hemostasis was maintained. The peritoneum was entered with hemostats. The peritoneal excision incision was extended bilaterally and curved slightly upwards with the Bovie, taking care to avoid the bladder. A transverse incision was made on the lower uterine  segment. I entered the uterine cavity with a hemostat. Clear fluid was noted. The baby was delivered from a vertex presentation.  The baby was transferred to the NICU personnel for care. Its weight and Apgars are still pending, but the baby was crying vigorously. A cord blood sample was obtained the placenta was extracted using traction. The uterus was left in situ. The uterine interior was cleaned with a dry lap sponge. The uterine incision was closed with 2 layers of 2-0 Vicryl suture in a running locking fashion, the second layer imbricating the first. Excellent hemostasis was noted. By tilting the uterus to either side, I was able to visualize the adnexa. They both were normal. The rectus muscles and rectus fascia were both noted to be hemostatic. The fascia was closed with a 0 vicryl in a running nonlocking fashion. No defects were palpable. Please note, that prior to making my initial incisions, I injected 30 mL of half percent bupivacaine into the subcutaneous tissue at the site of the proposed incision. I closed the subcutaneous tissue with a 3-0 Vicryl in a running nonlocking fashion. The incision angiocaths were correct. She tolerated the procedure well. Her Foley catheter drained clear throughout. She was taken to recovery in stable condition.

## 2011-08-29 NOTE — Progress Notes (Signed)
Trying to find fhr, palpating fetal movement.  Dr. Marice Potter orders to proceed with section without fhr.

## 2011-08-29 NOTE — Anesthesia Postprocedure Evaluation (Signed)
Anesthesia Post Note  Patient: Kari Collier  Procedure(s) Performed:  CESAREAN SECTION - primary cesarean section of baby  girl at 2238  APGAR 9/9  Anesthesia type: Spinal  Patient location: PACU  Post pain: Pain level controlled  Post assessment: Post-op Vital signs reviewed  Last Vitals:  Filed Vitals:   08/29/11 2330  BP: 122/68  Pulse: 76  Temp:   Resp: 27    Post vital signs: Reviewed  Level of consciousness: awake  Complications: No apparent anesthesia complications

## 2011-08-29 NOTE — Consult Note (Signed)
  Called to attend primary C/S for complete placenta previa with recent large hemorrhagic blood loss. Gestational age is 32 week 4 days. Has not received BMZ recently.  Infant in vertex and was delivered quickly and without difficulty. Spontaneous lusty cries and active movement of all extremities. Given tactile stimulation with drying and bulb suction to naso/oropharynx with minimal fluid obtained.   Wrapped in prewarmed blanket and shown to mother then placed into transport warmer an transported to NICU. Care to [facility].    Dagoberto Ligas MD [facility]

## 2011-08-29 NOTE — Progress Notes (Signed)
FACULTY PRACTICE ANTEPARTUM(COMPREHENSIVE) NOTE  Kari Collier is a 21 y.o. G2P1001 at [redacted]w[redacted]d by best clinical estimate who is admitted for placenta previa, second bleeding episode.   Fetal presentation is cephalic. Length of Stay:  4  Days  Subjective: Patient is noting only slight bright blood when wiping no clots since admission Patient reports the fetal movement as active. Patient reports uterine contraction  activity as minimal, only four contractions felt overnight. Patient reports  vaginal bleeding as scant staining. Patient describes fluid per vagina as None.  Vitals:  Blood pressure 108/71, pulse 86, temperature 98.7 F (37.1 C), temperature source Oral, resp. rate 18, height 5\' 5"  (1.651 m), weight 67.813 kg (149 lb 8 oz), SpO2 100.00%. Physical Examination:  Abdomen - soft, nontender, nondistended  Fetal Monitoring:  Baseline: 140 bpm, Variability: Fair (1-6 bpm) and Decelerations: Absent  Labs:  Recent Results (from the past 24 hour(s))  GLUCOSE, CAPILLARY   Collection Time   08/28/11 10:59 AM      Component Value Range   Glucose-Capillary 104 (*) 70 - 99 (mg/dL)   Comment 1 Notify RN     Comment 2 Documented in Chart    GLUCOSE, CAPILLARY   Collection Time   08/28/11  5:36 PM      Component Value Range   Glucose-Capillary 86  70 - 99 (mg/dL)   Comment 1 Notify RN     Comment 2 Documented in Chart    GLUCOSE, CAPILLARY   Collection Time   08/28/11 11:32 PM      Component Value Range   Glucose-Capillary 123 (*) 70 - 99 (mg/dL)   Comment 1 Documented in Chart    TYPE AND SCREEN   Collection Time   08/28/11 11:53 PM      Component Value Range   ABO/RH(D) O POS     Antibody Screen NEG     Sample Expiration 08/31/2011    GLUCOSE, CAPILLARY   Collection Time   08/29/11  8:50 AM      Component Value Range   Glucose-Capillary 65 (*) 70 - 99 (mg/dL)   Medications:  Scheduled    . docusate sodium  100 mg Oral Daily  . NIFEdipine  30 mg Oral BID  .  pantoprazole  40 mg Oral Q1200  . prenatal multivitamin  1 tablet Oral Daily   I have reviewed the patient's current medications.  ASSESSMENT: Patient Active Problem List  Diagnoses  . Placenta previa with hemorrhage, antepartum  . Preterm uterine contractions, antepartum  . Gestational diabetes  . Supervision of high-risk pregnancy    PLAN: Will switch from Mag sulfate back to Procardia xl 30 bid now that patient is stable. Iv at 50 cc/ hr Pt is s/p Betamethasone at her prior admit Elexus Barman V 08/29/2011,9:08 AM

## 2011-08-29 NOTE — Transfer of Care (Signed)
Immediate Anesthesia Transfer of Care Note  Patient: Kari Collier  Procedure(s) Performed:  CESAREAN SECTION - primary cesarean section of baby  girl at 2238  APGAR 9/9  Patient Location: PACU  Anesthesia Type: Spinal  Level of Consciousness: awake, alert  and oriented  Airway & Oxygen Therapy: Patient Spontanous Breathing  Post-op Assessment: Report given to PACU RN and Post -op Vital signs reviewed and stable  Post vital signs: stable Filed Vitals:   08/29/11 1956  BP: 109/64  Pulse: 82  Temp: 36.8 C  Resp: 18    Complications: No apparent anesthesia complications

## 2011-08-29 NOTE — OR Nursing (Signed)
Fundal massage DLWegner RN 

## 2011-08-29 NOTE — Progress Notes (Signed)
Dr. Marice Potter calls C-section due to bleeding.  Dr. Jean Rosenthal anesthesia in room.  Pt prepped for OR.  OR called and is ready.

## 2011-08-29 NOTE — Anesthesia Preprocedure Evaluation (Deleted)
Anesthesia Evaluation  Patient identified by MRN, date of birth, ID band Patient awake    Reviewed: Allergy & Precautions, H&P , Patient's Chart, lab work & pertinent test results, reviewed documented beta blocker date and time   History of Anesthesia Complications Negative for: history of anesthetic complications  Airway Mallampati: II TM Distance: >3 FB Neck ROM: full    Dental No notable dental hx.    Pulmonary neg pulmonary ROS,  clear to auscultation  Pulmonary exam normal       Cardiovascular Exercise Tolerance: Good neg cardio ROS regular Normal    Neuro/Psych Negative Neurological ROS  Negative Psych ROS   GI/Hepatic negative GI ROS, Neg liver ROS,   Endo/Other  Negative Endocrine ROS  Renal/GU negative Renal ROS     Musculoskeletal   Abdominal   Peds  Hematology negative hematology ROS (+)   Anesthesia Other Findings   Reproductive/Obstetrics negative OB ROS                           Anesthesia Physical Anesthesia Plan  ASA: III and Emergent  Anesthesia Plan: Spinal   Post-op Pain Management:    Induction:   Airway Management Planned:   Additional Equipment:   Intra-op Plan:   Post-operative Plan:   Informed Consent: I have reviewed the patients History and Physical, chart, labs and discussed the procedure including the risks, benefits and alternatives for the proposed anesthesia with the patient or authorized representative who has indicated his/her understanding and acceptance.   Dental Advisory Given  Plan Discussed with: CRNA and Surgeon  Anesthesia Plan Comments:        Anesthesia Quick Evaluation

## 2011-08-30 LAB — CBC
Hemoglobin: 9.3 g/dL — ABNORMAL LOW (ref 12.0–15.0)
MCH: 27.4 pg (ref 26.0–34.0)
RBC: 3.39 MIL/uL — ABNORMAL LOW (ref 3.87–5.11)

## 2011-08-30 LAB — GLUCOSE, CAPILLARY: Glucose-Capillary: 75 mg/dL (ref 70–99)

## 2011-08-30 MED ORDER — SODIUM CHLORIDE 0.9 % IJ SOLN
3.0000 mL | INTRAMUSCULAR | Status: DC | PRN
  Administered 2011-08-30: 3 mL via INTRAVENOUS

## 2011-08-30 MED ORDER — WITCH HAZEL-GLYCERIN EX PADS: 1.0000 "application " | MEDICATED_PAD | CUTANEOUS | Status: DC | PRN

## 2011-08-30 MED ORDER — ACETAMINOPHEN 10 MG/ML IV SOLN
1000.0000 mg | Freq: Four times a day (QID) | INTRAVENOUS | Status: DC | PRN
  Filled 2011-08-30: qty 100

## 2011-08-30 MED ORDER — IBUPROFEN 600 MG PO TABS: 600.0000 mg | ORAL_TABLET | Freq: Four times a day (QID) | ORAL | Status: DC | PRN

## 2011-08-30 MED ORDER — IBUPROFEN 600 MG PO TABS
600.0000 mg | ORAL_TABLET | Freq: Four times a day (QID) | ORAL | Status: DC
  Administered 2011-08-30 – 2011-09-02 (×10): 600 mg via ORAL
  Filled 2011-08-30 (×13): qty 1

## 2011-08-30 MED ORDER — NALBUPHINE SYRINGE 5 MG/0.5 ML
5.0000 mg | INJECTION | INTRAMUSCULAR | Status: DC | PRN
  Filled 2011-08-30: qty 1

## 2011-08-30 MED ORDER — ONDANSETRON HCL 4 MG/2ML IJ SOLN: 4.0000 mg | Freq: Three times a day (TID) | INTRAMUSCULAR | Status: DC | PRN

## 2011-08-30 MED ORDER — LANOLIN HYDROUS EX OINT: 1.0000 "application " | TOPICAL_OINTMENT | CUTANEOUS | Status: DC | PRN

## 2011-08-30 MED ORDER — SIMETHICONE 80 MG PO CHEW
80.0000 mg | CHEWABLE_TABLET | Freq: Three times a day (TID) | ORAL | Status: DC
  Administered 2011-08-30 – 2011-09-02 (×11): 80 mg via ORAL

## 2011-08-30 MED ORDER — ONDANSETRON HCL 4 MG PO TABS: 4.0000 mg | ORAL_TABLET | ORAL | Status: DC | PRN

## 2011-08-30 MED ORDER — DIPHENHYDRAMINE HCL 25 MG PO CAPS: 25.0000 mg | ORAL_CAPSULE | Freq: Four times a day (QID) | ORAL | Status: DC | PRN

## 2011-08-30 MED ORDER — SENNOSIDES-DOCUSATE SODIUM 8.6-50 MG PO TABS
2.0000 | ORAL_TABLET | Freq: Every day | ORAL | Status: DC
  Administered 2011-08-30 – 2011-09-01 (×3): 2 via ORAL

## 2011-08-30 MED ORDER — SIMETHICONE 80 MG PO CHEW: 80.0000 mg | CHEWABLE_TABLET | ORAL | Status: DC | PRN

## 2011-08-30 MED ORDER — METOCLOPRAMIDE HCL 5 MG/ML IJ SOLN: 10.0000 mg | Freq: Three times a day (TID) | INTRAMUSCULAR | Status: DC | PRN

## 2011-08-30 MED ORDER — TETANUS-DIPHTH-ACELL PERTUSSIS 5-2.5-18.5 LF-MCG/0.5 IM SUSP
0.5000 mL | Freq: Once | INTRAMUSCULAR | Status: AC
  Administered 2011-08-31: 0.5 mL via INTRAMUSCULAR
  Filled 2011-08-30: qty 0.5

## 2011-08-30 MED ORDER — PRENATAL MULTIVITAMIN CH
1.0000 | ORAL_TABLET | Freq: Every day | ORAL | Status: DC
Start: 2011-08-30 — End: 2011-09-02
  Administered 2011-08-30 – 2011-09-02 (×4): 1 via ORAL
  Filled 2011-08-30 (×4): qty 1

## 2011-08-30 MED ORDER — NALBUPHINE SYRINGE 5 MG/0.5 ML
5.0000 mg | INJECTION | INTRAMUSCULAR | Status: DC | PRN
  Administered 2011-08-30: 10 mg via SUBCUTANEOUS
  Filled 2011-08-30: qty 1

## 2011-08-30 MED ORDER — ZOLPIDEM TARTRATE 5 MG PO TABS: 5.0000 mg | ORAL_TABLET | Freq: Every evening | ORAL | Status: DC | PRN

## 2011-08-30 MED ORDER — OXYCODONE-ACETAMINOPHEN 5-325 MG PO TABS
1.0000 | ORAL_TABLET | ORAL | Status: DC | PRN
  Administered 2011-08-30 – 2011-09-01 (×5): 1 via ORAL
  Filled 2011-08-30 (×4): qty 1
  Filled 2011-08-30: qty 2

## 2011-08-30 MED ORDER — DIBUCAINE 1 % RE OINT: 1.0000 "application " | TOPICAL_OINTMENT | RECTAL | Status: DC | PRN

## 2011-08-30 MED ORDER — NALOXONE HCL 0.4 MG/ML IJ SOLN: 0.4000 mg | INTRAMUSCULAR | Status: DC | PRN

## 2011-08-30 MED ORDER — SODIUM CHLORIDE 0.9 % IV SOLN
1.0000 ug/kg/h | INTRAVENOUS | Status: DC | PRN
  Filled 2011-08-30: qty 2.5

## 2011-08-30 MED ORDER — MENTHOL 3 MG MT LOZG: 1.0000 | LOZENGE | OROMUCOSAL | Status: DC | PRN

## 2011-08-30 MED ORDER — LACTATED RINGERS IV SOLN: INTRAVENOUS | Status: DC

## 2011-08-30 MED ORDER — ONDANSETRON HCL 4 MG/2ML IJ SOLN: 4.0000 mg | INTRAMUSCULAR | Status: DC | PRN

## 2011-08-30 NOTE — Addendum Note (Signed)
Addendum  created 08/30/11 0931 by Edison Pace, CRNA   Modules edited:Notes Section

## 2011-08-30 NOTE — Progress Notes (Signed)
PSYCHOSOCIAL ASSESSMENT ~ MATERNAL/CHILD Name:  Kari Collier          Age:  21 years    Referral Date: 08/29/11 Reason/Source:  NICU admission I. FAMILY/HOME ENVIRONMENT A. Child's Legal Guardian Parent:    Kari Collier DOB:  October 22, 1990     Age: 21 Address:  [address] [place], [place] [address]  B. Other Household Members/Support Persons Kari Collier - 33 month old brother            C.   Other Support:  Maternal grandmother and maternal aunt  II. PSYCHOSOCIAL DATA A. Information Source X Patient Interview     B. Surveyor, quantity and [employer] X Medicaid: [employer]     X Food Stamps      X WIC  X [employer] - mom plans to reapply      [employer]- Income-based housing       C. Cultural and Environment Information/Cultural Issues Impacting Care: N/A III. STRENGTHS X Supportive family/friends   X Compliance with medical plan  X Understanding of illness           X Working towards earning her degree at [employer] IV. RISK FACTORS AND CURRENT PROBLEMS            X Financial Resources                         V. SOCIAL WORK ASSESSMENT Met with [name] at bedside to assess strengths, needs and coping following baby's NICU admission.  [name] reports she is familiar with this situation as her first son, who is now 21 months old, was in the NICU for 5 days.  [name] reports that her mother and sister are watching 21 month old during [name] stay.  [name] had not had any visitors up until this point, and she reported that her family was at church and planning to come this evening.  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  She was not working long enough to collect unemployment.  [name] reports she has no income.  She is in income-based housing, and does not report concerns with ability to pay utilities.  [name] reports having missed her [facility] and [employer] appointments due to  medical issues, but she plans to reapply.  Her 21 month old has WIC and Medicaid.  [name] reports being almost finished with prerequisites for application for entry into the nursing program at [employer].  She plans to resume school in the fall.  [name] reports that she has reliable transportation.  She was supposed to have her baby shower soon, as family did not expect [name] to delivery this early.  She currently only has a bassinet from her first baby. Maternal grandmother may provide some supplies, but [name] may be in need of additional supplies as baby get close to discharge.  [name] was accepting of child's NICU admission and did not have many questions at this time.  [name] was pleasant, cooperative, and communicative during visit.  She was receptive to information provided.  I discussed various ways we support or NICU families and encouraged [name] to ask questions, seek support, and utilize the supports available as needed.  [name] is currently pumping to supply breast milk for her baby.  She breast fed her 21 month old for four months, and feels confident about being  able to do so for her newborn.  Commended mom for utilizing her supports and resources.     VI. SOCIAL WORK PLAN X No Further Intervention Required/No Barriers to Discharge X Patient/Family Education: NICU supports/NICU brochure  Kari Acosta, LCSW, 08/30/11, 3:24 pm

## 2011-08-30 NOTE — Anesthesia Postprocedure Evaluation (Signed)
  Anesthesia Post-op Note  Patient: Kari Collier  Procedure(s) Performed:  CESAREAN SECTION - primary cesarean section of baby  girl at 2238  APGAR 9/9  Patient Location: [facility]  Anesthesia Type: Spinal  Level of Consciousness: awake and alert   Airway and Oxygen Therapy: Patient Spontanous Breathing  Post-op Pain: mild  Post-op Assessment: Patient's Cardiovascular Status Stable and Respiratory Function Stable  Post-op Vital Signs: Reviewed and stable  Complications: No apparent anesthesia complications

## 2011-08-31 ENCOUNTER — Encounter (HOSPITAL_COMMUNITY): Payer: Self-pay | Admitting: Obstetrics & Gynecology

## 2011-08-31 NOTE — Progress Notes (Signed)
Subjective: Postpartum Day 1: Cesarean Delivery Patient reports tolerating PO, + flatus, + BM and no problems voiding.    Objective: Vital signs in last 24 hours: Temp:  [97.8 F (36.6 C)-98.7 F (37.1 C)] 98.4 F (36.9 C) (01/14 0537) Pulse Rate:  [54-97] 54  (01/14 0537) Resp:  [17-20] 17  (01/14 0537) BP: (89-109)/(48-69) 109/69 mmHg (01/14 0537) SpO2:  [99 %-100 %] 100 % (01/14 0537)  Physical Exam:  General: alert, cooperative and no distress Lochia: appropriate Uterine Fundus: firm at U-2 Incision: healing well, no significant drainage, no dehiscence, no significant erythema DVT Evaluation: No evidence of DVT seen on physical exam.   Basename 08/30/11 0533  HGB 9.3*  HCT 28.1*    Assessment/Plan: Status post Cesarean section. Doing well postoperatively.  Continue current care. Likely discharge Tuesday to home care Tanya Crothers V 08/31/2011, 8:08 AM

## 2011-09-01 NOTE — Progress Notes (Signed)
UR Chart review completed.  

## 2011-09-01 NOTE — Progress Notes (Signed)
Subjective: Postpartum Day 2: Cesarean Delivery Patient reports incisional pain.  Desires lactation consult  Objective: Vital signs in last 24 hours: Temp:  [98.4 F (36.9 C)-98.7 F (37.1 C)] 98.4 F (36.9 C) (01/15 0609) Pulse Rate:  [77-99] 77  (01/15 0609) Resp:  [18-20] 18  (01/15 0609) BP: (103-114)/(58-77) 114/77 mmHg (01/15 0609) SpO2:  [98 %-100 %] 100 % (01/15 1610)  Physical Exam:  General: alert, cooperative and no distress Breast: left greater than right, soft BL, appropriately tender Lochia: appropriate Uterine Fundus: firm Incision: healing well, no significant drainage DVT Evaluation: No evidence of DVT seen on physical exam.   Basename 08/30/11 0533  HGB 9.3*  HCT 28.1*    Assessment/Plan: Status post Cesarean section. Doing well postoperatively.  Continue current care. D/C in am Braedon Sjogren E. 09/01/2011, 7:40 AM

## 2011-09-02 MED ORDER — OXYCODONE-ACETAMINOPHEN 5-325 MG PO TABS: 1.0000 | ORAL_TABLET | ORAL | Status: AC | PRN

## 2011-09-02 NOTE — Progress Notes (Signed)
DC instructions reviewed. Pt states complete understanding along with pt mother. Neither pt nor her mother had any questions or concerns. Prescription given

## 2011-09-02 NOTE — Progress Notes (Signed)
Subjective: Postpartum Day 4: Cesarean Delivery Patient reports tolerating PO, + flatus, + BM and no problems voiding.   Lactation saw patient twice yesterday.  Objective: Vital signs in last 24 hours: Temp:  [98 F (36.7 C)-99.4 F (37.4 C)] 98 F (36.7 C) (01/16 0526) Pulse Rate:  [64-108] 64  (01/16 0526) Resp:  [18] 18  (01/16 0526) BP: (96-125)/(60-77) 96/60 mmHg (01/16 0526) SpO2:  [96 %-100 %] 100 % (01/16 0526)  Physical Exam:  General: alert and no distress Lochia: lighter than a period Uterine Fundus: firm Incision: healing well, no significant drainage, no dehiscence, no significant erythema DVT Evaluation: No evidence of DVT seen on physical exam. No cords or calf tenderness. No significant calf/ankle edema.  No results found for this basename: HGB:2,HCT:2 in the last 72 hours  Assessment/Plan: Status post Cesarean section. Doing well postoperatively.  Discharge home with standard precautions and return to clinic in 4-6 weeks. Birth control: Micronor Breastfeeding without concerns.   Kari Collier 09/02/2011, 7:31 AM

## 2011-09-02 NOTE — Discharge Summary (Signed)
Obstetric Discharge Summary Reason for Admission: worsening vaginal bleeding in patient with known placenta previa  Dates of Hospitalization: 01/08-01/16/2013 Intrapartum Procedures: cesarean: low cervical, transverse on 08/29/2011 Postpartum Procedures: none Complications-Operative and Postpartum: none Hemoglobin  Date Value Range Status  08/30/2011 9.3* 12.0-15.0 (g/dL) Final     HCT  Date Value Range Status  08/30/2011 28.1* 36.0-46.0 (%) Final    Discharge Diagnoses: Placenta previa and pregnancy delivered  Brief Hospital Course: This is a 21 year old female who presented on admission as a G2P1001 @ 31 6/7 weeks with a history of gestational diabetes (diet controlled) and placenta previa during her current pregnancy with worsening vaginal bleeding. She had been previously admitted for vaginal bleeding on 12/08-12/24/2012. She was also found to be having preterm uterine contractions despite being on Procardia at home. Her Hgb on admission was 10.6 (it had been12.6 on 08/18/2011). The patient was admitted for observation for the remainder of her pregnancy and placed on bedrest.   The patient continued to have bleeding and contractions during her hospitalization. To help her contractions, the patient was continued on Procardia and given occasional terbutaline initially then switched to magnesium sulfate from 01/10-01/07/2012 due to having persistent contractions. She did better on the magnesium sulfate and was switched back to Procardia on 01/12 after her contractions appropriately decreased.   The patient, however, continued to have persistent vaginal bleeding. After she was found to have an episode of bleeding consisting of about 500 mL on 01/12, the obstetrical team decided to proceed with a Cesaerean section at 32 4/7 weeks . The procedure went as expected without intraoperative complications; please refer to Dr. Trey Paula operative note on 01/12 for full details. The neonatology team was  present during the delivery, and the infant appeared healthy and vigorous.  The patient did well post-operatively. Her Hgb on POD#1 was 9.3. She was tolerating her diet and had bowel movements. She remained afebrile, and her incisional pain was controlled with occasional percocet and appeared to be healing well. She was discharged on POD#4 in stable condition.   The patients gestational diabetes was appropriately controlled with diet during and after her pregnancy with sugars typically ranging from 70-90s.    Consults: Nutrition Discharge Information: Date: 09/02/2011 Activity: pelvic rest Diet: routine Medications: PNV, Colace and Percocet Condition: stable Instructions: refer to practice specific booklet Discharge to: home   Newborn Data: Live born female  Birth Weight: 3 lb 11.6 oz (1690 g) APGAR: 9, 9  Home with mother.  [name], [name] 09/02/2011, 7:42 AM

## 2011-10-07 ENCOUNTER — Encounter: Payer: Self-pay | Admitting: Advanced Practice Midwife

## 2011-10-07 ENCOUNTER — Ambulatory Visit (INDEPENDENT_AMBULATORY_CARE_PROVIDER_SITE_OTHER): Payer: Medicaid Other | Admitting: Advanced Practice Midwife

## 2011-10-07 VITALS — BP 116/68 | HR 71 | Temp 97.8°F | Ht 65.0 in | Wt 130.3 lb

## 2011-10-07 DIAGNOSIS — O24419 Gestational diabetes mellitus in pregnancy, unspecified control: Secondary | ICD-10-CM

## 2011-10-07 MED ORDER — NORETHINDRONE ACET-ETHINYL EST 1-20 MG-MCG PO TABS: 1.0000 | ORAL_TABLET | Freq: Every day | ORAL | Status: AC

## 2011-10-07 NOTE — Progress Notes (Unsigned)
  Subjective:     Kari Collier is a 21 y.o. female who presents for a postpartum visit. She is 5 weeks postpartum following a low cervical transverse Cesarean section. I have fully reviewed the prenatal and intrapartum course. The delivery was at 34.4 gestational weeks. Outcome: primary cesarean section, low transverse incision. Anesthesia: spinal. Postpartum course has been normal. Baby's course has been In NICU. Baby is feeding by {breast/bottle:69}. Bleeding {vag bleed:12292}. Bowel function is {normal:32111}. Bladder function is {normal:32111}. Patient {is/is not:9024} sexually active. Contraception method is {contraceptive method:5051}. Postpartum depression screening: {neg default:13464::"negative"}.  {Common ambulatory SmartLinks:19316}  Review of Systems {ros; complete:30496}   Objective:    BP 116/68  Pulse 71  Temp(Src) 97.8 F (36.6 C) (Oral)  Ht 5\' 5"  (1.651 m)  Wt 130 lb 4.8 oz (59.104 kg)  BMI 21.68 kg/m2  LMP 10/02/2011  Breastfeeding? Yes  General:  {gen appearance:16600}   Breasts:  {breast exam:1202::"inspection negative, no nipple discharge or bleeding, no masses or nodularity palpable"}  Lungs: {lung exam:16931}  Heart:  {heart exam:5510}  Abdomen: {abdomen exam:16834}   Vulva:  {labia exam:12198}  Vagina: {vagina exam:12200}  Cervix:  {cervix exam:14595}  Corpus: {uterus exam:12215}  Adnexa:  {adnexa exam:12223}  Rectal Exam: {rectal/vaginal exam:12274}        Assessment:    *** postpartum exam. Pap smear {done:10129} at today's visit.   Plan:    1. Contraception: {method:5051} 2. *** 3. Follow up in: {1-10:13787} {time; units:19136} or as needed.

## 2011-10-07 NOTE — Patient Instructions (Signed)
Glucose Tolerance Test This is a test to see how your body processes carbohydrates. This test is often done to check patients for diabetes or the possibility of developing it. PREPARATION FOR TEST You should have nothing to eat or drink 12 hours before the test. You will be given a form of sugar (glucose) and then blood samples will be drawn from your vein to determine the level of sugar in your blood. Alternatively, blood may be drawn from your finger for testing. You should not smoke or exercise during the test. NORMAL FINDINGS  Fasting: 70-115 mg/dL   30 minutes: less than 200 mg/dL   1 hour: less than 161 mg/dL   2 hours: less than 096 mg/dL   3 hours: 04-540 mg/dL   4 hours: 98-119 mg/dL  Ranges for normal findings may vary among different laboratories and hospitals. You should always check with your doctor after having lab work or other tests done to discuss the meaning of your test results and whether your values are considered within normal limits. MEANING OF TEST Your caregiver will go over the test results with you and discuss the importance and meaning of your results, as well as treatment options and the need for additional tests. OBTAINING THE TEST RESULTS It is your responsibility to obtain your test results. Ask the lab or department performing the test when and how you will get your results. Document Released: 08/26/2004 Document Revised: 04/15/2011 Document Reviewed: 07/14/2008 [facility] Patient Information 2012 [place], [place].

## 2014-06-18 ENCOUNTER — Encounter: Payer: Self-pay | Admitting: Advanced Practice Midwife

## 2020-05-24 ENCOUNTER — Emergency Department (HOSPITAL_BASED_OUTPATIENT_CLINIC_OR_DEPARTMENT_OTHER)
Admission: EM | Admit: 2020-05-24 | Discharge: 2020-05-24 | Disposition: A | Discharge status: Home / Self Care | Payer: BC Managed Care – PPO | Attending: Emergency Medicine | Admitting: Emergency Medicine

## 2020-05-24 ENCOUNTER — Emergency Department (HOSPITAL_BASED_OUTPATIENT_CLINIC_OR_DEPARTMENT_OTHER): Payer: BC Managed Care – PPO

## 2020-05-24 ENCOUNTER — Other Ambulatory Visit: Payer: Self-pay

## 2020-05-24 ENCOUNTER — Encounter (HOSPITAL_BASED_OUTPATIENT_CLINIC_OR_DEPARTMENT_OTHER): Payer: Self-pay | Admitting: *Deleted

## 2020-05-24 DIAGNOSIS — R102 Pelvic and perineal pain: Secondary | ICD-10-CM | POA: Diagnosis present

## 2020-05-24 DIAGNOSIS — R1032 Left lower quadrant pain: Secondary | ICD-10-CM | POA: Diagnosis not present

## 2020-05-24 LAB — COMPREHENSIVE METABOLIC PANEL
ALT: 18 U/L (ref 0–44)
AST: 20 U/L (ref 15–41)
Albumin: 4 g/dL (ref 3.5–5.0)
Alkaline Phosphatase: 57 U/L (ref 38–126)
Anion gap: 8 (ref 5–15)
BUN: 13 mg/dL (ref 6–20)
CO2: 25 mmol/L (ref 22–32)
Calcium: 9 mg/dL (ref 8.9–10.3)
Chloride: 103 mmol/L (ref 98–111)
Creatinine, Ser: 0.72 mg/dL (ref 0.44–1.00)
GFR, Estimated: >60 mL/min (ref >60)
Glucose, Bld: 86 mg/dL (ref 70–99)
Potassium: 3.6 mmol/L (ref 3.5–5.1)
Sodium: 136 mmol/L (ref 135–145)
Total Bilirubin: 0.7 mg/dL (ref 0.3–1.2)
Total Protein: 7 g/dL (ref 6.5–8.1)

## 2020-05-24 LAB — CBC WITH DIFFERENTIAL/PLATELET
Abs Immature Granulocytes: 0.04 10*3/uL (ref 0.00–0.07)
Basophils Absolute: 0 10*3/uL (ref 0.0–0.1)
Basophils Relative: 0 %
Eosinophils Absolute: 0.2 10*3/uL (ref 0.0–0.5)
Eosinophils Relative: 4 %
HCT: 40.6 % (ref 36.0–46.0)
Hemoglobin: 13.4 g/dL (ref 12.0–15.0)
Immature Granulocytes: 1 %
Lymphocytes Relative: 36 %
Lymphs Abs: 1.8 10*3/uL (ref 0.7–4.0)
MCH: 29.4 pg (ref 26.0–34.0)
MCHC: 33 g/dL (ref 30.0–36.0)
MCV: 89 fL (ref 80.0–100.0)
Monocytes Absolute: 0.5 10*3/uL (ref 0.1–1.0)
Monocytes Relative: 9 %
Neutro Abs: 2.6 10*3/uL (ref 1.7–7.7)
Neutrophils Relative %: 50 %
Platelets: 304 10*3/uL (ref 150–400)
RBC: 4.56 MIL/uL (ref 3.87–5.11)
RDW: 12.6 % (ref 11.5–15.5)
WBC: 5.2 10*3/uL (ref 4.0–10.5)
nRBC: 0 % (ref 0.0–0.2)

## 2020-05-24 LAB — PREGNANCY, URINE: Preg Test, Ur: NEGATIVE

## 2020-05-24 LAB — URINALYSIS, ROUTINE W REFLEX MICROSCOPIC
Bilirubin Urine: NEGATIVE
Glucose, UA: NEGATIVE mg/dL
Ketones, ur: NEGATIVE mg/dL
Nitrite: NEGATIVE
Protein, ur: NEGATIVE mg/dL
Specific Gravity, Urine: >1.03 — ABNORMAL HIGH (ref 1.005–1.030)
pH: 6 (ref 5.0–8.0)

## 2020-05-24 LAB — URINALYSIS, MICROSCOPIC (REFLEX)

## 2020-05-24 LAB — LIPASE, BLOOD: Lipase: 26 U/L (ref 11–51)

## 2020-05-24 MED ORDER — IOHEXOL 300 MG/ML  SOLN
100.0000 mL | Freq: Once | INTRAMUSCULAR | Status: AC | PRN
  Administered 2020-05-24: 100 mL via INTRAVENOUS

## 2020-05-24 MED ORDER — SODIUM CHLORIDE 0.9 % IV BOLUS
500.0000 mL | Freq: Once | INTRAVENOUS | Status: AC
  Administered 2020-05-24: 500 mL via INTRAVENOUS

## 2020-05-24 MED ORDER — DICYCLOMINE HCL 10 MG PO CAPS
10.0000 mg | ORAL_CAPSULE | Freq: Once | ORAL | Status: AC
  Administered 2020-05-24: 10 mg via ORAL
  Filled 2020-05-24: qty 1

## 2020-05-24 MED ORDER — DICYCLOMINE HCL 20 MG PO TABS: 20.0000 mg | ORAL_TABLET | Freq: Three times a day (TID) | ORAL | 0 refills | Status: AC | PRN

## 2020-05-24 NOTE — Discharge Instructions (Addendum)
Your CT scan today showed a cyst on your left kidney and a cyst on your right ovary that are stable when compared to prior studies.  The cause of your pain was not identified.  Please follow up with your OB GYN for recheck.

## 2020-05-24 NOTE — ED Triage Notes (Signed)
C/o pelvic pain and urinary symptoms x 1 week

## 2020-05-24 NOTE — ED Provider Notes (Signed)
[facility] EMERGENCY DEPARTMENT Provider Note   CSN: [id number] Arrival date & time: 05/24/20  1934     History Chief Complaint  Patient presents with  . Pelvic Pain    Kari Collier is a 29 y.o. female.  The history is provided by the patient and medical records. No language interpreter was used.  Pelvic Pain   Kari Collier is a 29 y.o. female who presents to the Emergency Department complaining of pelvic pain.  She has several weeks of pelvic/lower abdominal pain.  Pain is described as burning.  It is waxing and waning, worse with urination.  Denies fevers, N/V, diarrhea, constipation.  She saw her OBGYN one week ago and was diagnosed with BV and has been compliant with her medication.  No vaginal discharge.  She is sexually active, no new partners.  Today she noticed her urine was very dark.  She has been drinking fluids.  Has a hx/o hysterectomy in 2017.      Past Medical History:  Diagnosis Date  . No pertinent past medical history     Patient Active Problem List   Diagnosis Date Noted  . Supervision of high-risk pregnancy 08/17/2011    Past Surgical History:  Procedure Laterality Date  . ABDOMINAL HYSTERECTOMY    . CESAREAN SECTION  08/29/2011   Procedure: CESAREAN SECTION;  Surgeon: Hollie Salk C. Marice Potter, MD;  Location: [facility];  Service: Gynecology;  Laterality: N/A;  primary cesarean section of baby  girl at 2238  APGAR 9/9  . NO PAST SURGERIES       OB History    Gravida  2   Para  2   Term  1   Preterm  1   AB  0   Living  2     SAB  0   TAB  0   Ectopic  0   Multiple  0   Live Births  2           Family History  Problem Relation Age of Onset  . Diabetes Father   . Hypertension Father     Social History   Tobacco Use  . Smoking status: Never Smoker  . Smokeless tobacco: Never Used  Substance Use Topics  . Alcohol use: No  . Drug use: No    Home Medications Prior to Admission medications   Medication Sig Start  Date End Date Taking? Authorizing Provider  Casanthranol-Docusate Sodium 30-100 MG CAPS Take 1 capsule by mouth daily.      [provider]  dicyclomine (BENTYL) 20 MG tablet Take 1 tablet (20 mg total) by mouth 3 (three) times daily as needed for spasms. 05/24/20   Tilden Fossa, MD  norethindrone-ethinyl estradiol (LOESTRIN 1/20, 21,) 1-20 MG-MCG tablet Take 1 tablet by mouth daily. 10/07/11 10/06/12  Katrinka Blazing, [place], CNM  pantoprazole (PROTONIX) 40 MG tablet Take 1 tablet (40 mg total) by mouth daily at 12 noon. 08/10/11 08/09/12  Lesly Dukes, MD  Prenatal Vitamins (DIS) TABS Take 1 tablet by mouth daily.     [provider]    Allergies    Patient has no known allergies.  Review of Systems   Review of Systems  Genitourinary: Positive for pelvic pain.  All other systems reviewed and are negative.   Physical Exam Updated Vital Signs BP 117/82   Pulse 79   Temp 98.1 F (36.7 C) (Oral)   Resp 18   Ht 5\' 5"  (1.651 m)   Wt 70.3 kg  LMP 10/02/2011   SpO2 98%   BMI 25.79 kg/m   Physical Exam Vitals and nursing note reviewed.  Constitutional:      Appearance: She is well-developed.  HENT:     Head: Normocephalic and atraumatic.  Cardiovascular:     Rate and Rhythm: Normal rate and regular rhythm.     Heart sounds: No murmur heard.   Pulmonary:     Effort: Pulmonary effort is normal. No respiratory distress.     Breath sounds: Normal breath sounds.  Abdominal:     Palpations: Abdomen is soft.     Tenderness: There is abdominal tenderness. There is no guarding or rebound.     Comments: Moderate LLQ tenderness  Musculoskeletal:        General: No tenderness.  Skin:    General: Skin is warm and dry.  Neurological:     Mental Status: She is alert and oriented to person, place, and time.  Psychiatric:        Behavior: Behavior normal.     ED Results / Procedures / Treatments   Labs (all labs ordered are listed, but only abnormal results are  displayed) Labs Reviewed  URINALYSIS, ROUTINE W REFLEX MICROSCOPIC - Abnormal; Notable for the following components:      Result Value   APPearance CLOUDY (*)    Specific Gravity, Urine >1.030 (*)    Hgb urine dipstick TRACE (*)    Leukocytes,Ua TRACE (*)    All other components within normal limits  URINALYSIS, MICROSCOPIC (REFLEX) - Abnormal; Notable for the following components:   Bacteria, UA RARE (*)    All other components within normal limits  URINE CULTURE  PREGNANCY, URINE  COMPREHENSIVE METABOLIC PANEL  CBC WITH DIFFERENTIAL/PLATELET  LIPASE, BLOOD    EKG None  Radiology CT Abdomen Pelvis W Contrast  Result Date: 05/24/2020 CLINICAL DATA:  Acute abdominal pain for 1 week EXAM: CT ABDOMEN AND PELVIS WITH CONTRAST TECHNIQUE: Multidetector CT imaging of the abdomen and pelvis was performed using the standard protocol following bolus administration of intravenous contrast. CONTRAST:  OMNIPAQUE IOHEXOL 300 MG/ML  SOLN COMPARISON:  11/04/2018 FINDINGS: Lower chest: No acute abnormality. Hepatobiliary: No focal liver abnormality is seen. No gallstones, gallbladder wall thickening, or biliary dilatation. Pancreas: Unremarkable. No pancreatic ductal dilatation or surrounding inflammatory changes. Spleen: Normal in size without focal abnormality. Adrenals/Urinary Tract: Adrenal glands are within normal limits. Kidneys are well visualized bilaterally. No renal calculi are seen. Cystic lesion is again noted within the left kidney stable from the prior study. No obstructive changes are seen. The bladder is decompressed. Stomach/Bowel: The appendix is within normal limits. No obstructive or inflammatory changes of the colon are seen. Small bowel is unremarkable. Stomach is decompressed. Vascular/Lymphatic: No significant vascular findings are present. No enlarged abdominal or pelvic lymph nodes. Reproductive: Status post hysterectomy. Cystic changes are noted in the right ovary slightly  decreased when compare with the prior exam. Other: No abdominal wall hernia or abnormality. No abdominopelvic ascites. Musculoskeletal: No acute or significant osseous findings. IMPRESSION: Cystic lesion within the left kidney stable from the prior CT and MRI. Cystic change in the right ovary which have decreased in the interval from the prior CT. No other focal abnormality is noted. Electronically Signed   By: Alcide Clever M.D.   On: 05/24/2020 22:15    Procedures Procedures (including critical care time)  Medications Ordered in ED Medications  sodium chloride 0.9 % bolus 500 mL (0 mLs Intravenous Stopped 05/24/20 2246)  dicyclomine (BENTYL) capsule 10 mg (10 mg Oral Given 05/24/20 2134)  iohexol (OMNIPAQUE) 300 MG/ML solution 100 mL (100 mLs Intravenous Contrast Given 05/24/20 2205)    ED Course  I have reviewed the triage vital signs and the nursing notes.  Pertinent labs & imaging results that were available during my care of the patient were reviewed by me and considered in my medical decision making (see chart for details).    MDM Rules/Calculators/A&P                         patient with history of hysterectomy here for evaluation of progressive pelvic pain over the last few weeks, recently seen by OB/GYN and treated for BV with antibiotics. She does have left lower quadrant tenderness on examination. UA is concentrated but not consistent with UTI. CT abdomen pelvis was obtained, which demonstrates stable renal cyst as well as ovarian cyst that is decreasing in size. Presentation is not consistent with torsion. Patient symptoms are partially improved after bentyl administration. Discussed with patient unclear source of symptoms, will treat symptomatically with bentyl PRN. Discussed OB/GYN follow-up as well as return precautions.  Final Clinical Impression(s) / ED Diagnoses Final diagnoses:  LLQ pain    Rx / DC Orders ED Discharge Orders         Ordered    dicyclomine (BENTYL) 20 MG  tablet  3 times daily PRN        05/24/20 2233           Tilden Fossa, MD 05/24/20 2343

## 2020-05-24 NOTE — ED Notes (Signed)
AVS reviewed with client, also informed of Rx sent electronically to the pharmacy she has listed on her chart. Opportunity for questions provided as well. Copy of AVS provided to client also

## 2020-05-26 LAB — URINE CULTURE: Culture: NO GROWTH

## 2020-07-20 ENCOUNTER — Emergency Department (HOSPITAL_BASED_OUTPATIENT_CLINIC_OR_DEPARTMENT_OTHER)
Admission: EM | Admit: 2020-07-20 | Discharge: 2020-07-20 | Disposition: A | Discharge status: Home / Self Care | Payer: BC Managed Care – PPO | Attending: Emergency Medicine | Admitting: Emergency Medicine

## 2020-07-20 ENCOUNTER — Encounter (HOSPITAL_BASED_OUTPATIENT_CLINIC_OR_DEPARTMENT_OTHER): Payer: Self-pay | Admitting: Emergency Medicine

## 2020-07-20 ENCOUNTER — Other Ambulatory Visit: Payer: Self-pay

## 2020-07-20 DIAGNOSIS — J069 Acute upper respiratory infection, unspecified: Secondary | ICD-10-CM | POA: Diagnosis not present

## 2020-07-20 DIAGNOSIS — H9202 Otalgia, left ear: Secondary | ICD-10-CM | POA: Diagnosis not present

## 2020-07-20 DIAGNOSIS — R110 Nausea: Secondary | ICD-10-CM

## 2020-07-20 DIAGNOSIS — Z20822 Contact with and (suspected) exposure to covid-19: Secondary | ICD-10-CM | POA: Insufficient documentation

## 2020-07-20 DIAGNOSIS — R509 Fever, unspecified: Secondary | ICD-10-CM

## 2020-07-20 DIAGNOSIS — R070 Pain in throat: Secondary | ICD-10-CM | POA: Diagnosis present

## 2020-07-20 LAB — URINALYSIS, ROUTINE W REFLEX MICROSCOPIC
Bilirubin Urine: NEGATIVE
Glucose, UA: NEGATIVE mg/dL
Hgb urine dipstick: NEGATIVE
Ketones, ur: NEGATIVE mg/dL
Leukocytes,Ua: NEGATIVE
Nitrite: NEGATIVE
Protein, ur: NEGATIVE mg/dL
Specific Gravity, Urine: 1.015 (ref 1.005–1.030)
pH: >9 — ABNORMAL HIGH (ref 5.0–8.0)

## 2020-07-20 LAB — CBC
HCT: 42.3 % (ref 36.0–46.0)
Hemoglobin: 14.3 g/dL (ref 12.0–15.0)
MCH: 29.4 pg (ref 26.0–34.0)
MCHC: 33.8 g/dL (ref 30.0–36.0)
MCV: 86.9 fL (ref 80.0–100.0)
Platelets: 274 10*3/uL (ref 150–400)
RBC: 4.87 MIL/uL (ref 3.87–5.11)
RDW: 12.4 % (ref 11.5–15.5)
WBC: 9.1 10*3/uL (ref 4.0–10.5)
nRBC: 0 % (ref 0.0–0.2)

## 2020-07-20 LAB — COMPREHENSIVE METABOLIC PANEL
ALT: 18 U/L (ref 0–44)
AST: 20 U/L (ref 15–41)
Albumin: 4.1 g/dL (ref 3.5–5.0)
Alkaline Phosphatase: 72 U/L (ref 38–126)
Anion gap: 9 (ref 5–15)
BUN: 8 mg/dL (ref 6–20)
CO2: 21 mmol/L — ABNORMAL LOW (ref 22–32)
Calcium: 9.1 mg/dL (ref 8.9–10.3)
Chloride: 101 mmol/L (ref 98–111)
Creatinine, Ser: 0.86 mg/dL (ref 0.44–1.00)
GFR, Estimated: >60 mL/min (ref >60)
Glucose, Bld: 116 mg/dL — ABNORMAL HIGH (ref 70–99)
Potassium: 3.6 mmol/L (ref 3.5–5.1)
Sodium: 131 mmol/L — ABNORMAL LOW (ref 135–145)
Total Bilirubin: 0.6 mg/dL (ref 0.3–1.2)
Total Protein: 7.6 g/dL (ref 6.5–8.1)

## 2020-07-20 LAB — LIPASE, BLOOD: Lipase: 23 U/L (ref 11–51)

## 2020-07-20 LAB — RESP PANEL BY RT-PCR (FLU A&B, COVID) ARPGX2
Influenza A by PCR: NEGATIVE
Influenza B by PCR: NEGATIVE
SARS Coronavirus 2 by RT PCR: NEGATIVE

## 2020-07-20 LAB — GROUP A STREP BY PCR: Group A Strep by PCR: NOT DETECTED

## 2020-07-20 MED ORDER — ACETAMINOPHEN 325 MG PO TABS
650.0000 mg | ORAL_TABLET | Freq: Once | ORAL | Status: AC | PRN
  Administered 2020-07-20: 650 mg via ORAL
  Filled 2020-07-20: qty 2

## 2020-07-20 MED ORDER — ONDANSETRON 4 MG PO TBDP: 4.0000 mg | ORAL_TABLET | Freq: Three times a day (TID) | ORAL | 0 refills | Status: AC | PRN

## 2020-07-20 MED ORDER — IBUPROFEN 600 MG PO TABS: 600.0000 mg | ORAL_TABLET | Freq: Four times a day (QID) | ORAL | 0 refills | Status: AC | PRN

## 2020-07-20 MED ORDER — ONDANSETRON 4 MG PO TBDP
4.0000 mg | ORAL_TABLET | Freq: Once | ORAL | Status: AC | PRN
  Administered 2020-07-20: 4 mg via ORAL
  Filled 2020-07-20: qty 1

## 2020-07-20 NOTE — ED Triage Notes (Signed)
Pt arrives pov with c/o HA and left ear pain that started last night after washing hair. Pt reports left lower back pain with fatigue. Pt also reports nausea with emesis and reports decreased appetite. Pt c/o dizziness after arrival.

## 2020-07-20 NOTE — ED Provider Notes (Signed)
[facility] EMERGENCY DEPARTMENT Provider Note   CSN: [id number] Arrival date & time: 07/20/20  1306     History Chief Complaint  Patient presents with  . Emesis    Kari Collier is a 29 y.o. female.  Pt presents to the ED today with a sore throat, left ear pain. Headache and nausea.  She has not eaten since yesterday.  She was given tylenol and zofran during her wait and is feeling better now.  She has been vaccinated against Covid.         Past Medical History:  Diagnosis Date  . No pertinent past medical history     Patient Active Problem List   Diagnosis Date Noted  . Supervision of high-risk pregnancy 08/17/2011    Past Surgical History:  Procedure Laterality Date  . ABDOMINAL HYSTERECTOMY    . CESAREAN SECTION  08/29/2011   Procedure: CESAREAN SECTION;  Surgeon: Hollie Salk C. Marice Potter, MD;  Location: [facility];  Service: Gynecology;  Laterality: N/A;  primary cesarean section of baby  girl at 2238  APGAR 9/9  . NO PAST SURGERIES       OB History    Gravida  2   Para  2   Term  1   Preterm  1   AB  0   Living  2     SAB  0   TAB  0   Ectopic  0   Multiple  0   Live Births  2           Family History  Problem Relation Age of Onset  . Diabetes Father   . Hypertension Father     Social History   Tobacco Use  . Smoking status: Never Smoker  . Smokeless tobacco: Never Used  Substance Use Topics  . Alcohol use: Not Currently  . Drug use: No    Home Medications Prior to Admission medications   Medication Sig Start Date End Date Taking? Authorizing Provider  Casanthranol-Docusate Sodium 30-100 MG CAPS Take 1 capsule by mouth daily.      [provider]  dicyclomine (BENTYL) 20 MG tablet Take 1 tablet (20 mg total) by mouth 3 (three) times daily as needed for spasms. 05/24/20   Tilden Fossa, MD  ibuprofen (ADVIL) 600 MG tablet Take 1 tablet (600 mg total) by mouth every 6 (six) hours as needed. 07/20/20   Jacalyn Lefevre, MD  norethindrone-ethinyl estradiol (LOESTRIN 1/20, 21,) 1-20 MG-MCG tablet Take 1 tablet by mouth daily. 10/07/11 10/06/12  Katrinka Blazing, [place], CNM  ondansetron (ZOFRAN ODT) 4 MG disintegrating tablet Take 1 tablet (4 mg total) by mouth every 8 (eight) hours as needed for nausea or vomiting. 07/20/20   Jacalyn Lefevre, MD  pantoprazole (PROTONIX) 40 MG tablet Take 1 tablet (40 mg total) by mouth daily at 12 noon. 08/10/11 08/09/12  Lesly Dukes, MD  Prenatal Vitamins (DIS) TABS Take 1 tablet by mouth daily.     [provider]    Allergies    Patient has no known allergies.  Review of Systems   Review of Systems  Constitutional: Positive for chills and fever.  HENT: Positive for ear pain and sore throat.   Gastrointestinal: Positive for nausea.  All other systems reviewed and are negative.   Physical Exam Updated Vital Signs BP 124/83 (BP Location: Right Arm)   Pulse 96   Temp (!) 100.6 F (38.1 C) (Oral)   Resp 20   Ht 5\' 5"  (1.651 m)  Wt 73.5 kg   LMP 10/02/2011   SpO2 97%   BMI 26.96 kg/m   Physical Exam Vitals and nursing note reviewed.  Constitutional:      Appearance: Normal appearance.  HENT:     Head: Normocephalic and atraumatic.     Right Ear: Tympanic membrane, ear canal and external ear normal.     Left Ear: Tympanic membrane, ear canal and external ear normal.     Nose: Nose normal.     Mouth/Throat:     Pharynx: Posterior oropharyngeal erythema present.  Eyes:     Extraocular Movements: Extraocular movements intact.     Conjunctiva/sclera: Conjunctivae normal.     Pupils: Pupils are equal, round, and reactive to light.  Cardiovascular:     Rate and Rhythm: Normal rate and regular rhythm.     Pulses: Normal pulses.     Heart sounds: Normal heart sounds.  Pulmonary:     Effort: Pulmonary effort is normal.  Abdominal:     General: Abdomen is flat. Bowel sounds are normal.     Palpations: Abdomen is soft.  Musculoskeletal:         General: Normal range of motion.     Cervical back: Normal range of motion.  Lymphadenopathy:     Cervical: Cervical adenopathy present.  Skin:    General: Skin is warm.     Capillary Refill: Capillary refill takes less than 2 seconds.  Neurological:     General: No focal deficit present.     Mental Status: She is alert and oriented to person, place, and time.  Psychiatric:        Mood and Affect: Mood normal.        Behavior: Behavior normal.        Thought Content: Thought content normal.        Judgment: Judgment normal.     ED Results / Procedures / Treatments   Labs (all labs ordered are listed, but only abnormal results are displayed) Labs Reviewed  COMPREHENSIVE METABOLIC PANEL - Abnormal; Notable for the following components:      Result Value   Sodium 131 (*)    CO2 21 (*)    Glucose, Bld 116 (*)    All other components within normal limits  URINALYSIS, ROUTINE W REFLEX MICROSCOPIC - Abnormal; Notable for the following components:   APPearance HAZY (*)    pH >9.0 (*)    All other components within normal limits  RESP PANEL BY RT-PCR (FLU A&B, COVID) ARPGX2  GROUP A STREP BY PCR  LIPASE, BLOOD  CBC    EKG None  Radiology No results found.  Procedures Procedures (including critical care time)  Medications Ordered in ED Medications  ondansetron (ZOFRAN-ODT) disintegrating tablet 4 mg (4 mg Oral Given 07/20/20 1341)  acetaminophen (TYLENOL) tablet 650 mg (650 mg Oral Given 07/20/20 1610)    ED Course  I have reviewed the triage vital signs and the nursing notes.  Pertinent labs & imaging results that were available during my care of the patient were reviewed by me and considered in my medical decision making (see chart for details).    MDM Rules/Calculators/A&P                          Fever is down after tylenol.  Pt is eating and drinking after zofran.  Covid negative.  Strep and urine negative.  Pt is stable for d/c.  Return if  worse.  Kari Collier was evaluated in Emergency Department on 07/20/2020 for the symptoms described in the history of present illness. She was evaluated in the context of the global COVID-19 pandemic, which necessitated consideration that the patient might be at risk for infection with the SARS-CoV-2 virus that causes COVID-19. Institutional protocols and algorithms that pertain to the evaluation of patients at risk for COVID-19 are in a state of rapid change based on information released by regulatory bodies including the CDC and federal and state organizations. These policies and algorithms were followed during the patient's care in the ED. Final Clinical Impression(s) / ED Diagnoses Final diagnoses:  Fever, unspecified fever cause  Viral upper respiratory tract infection  Nausea    Rx / DC Orders ED Discharge Orders         Ordered    ondansetron (ZOFRAN ODT) 4 MG disintegrating tablet  Every 8 hours PRN        07/20/20 2029    ibuprofen (ADVIL) 600 MG tablet  Every 6 hours PRN        07/20/20 2029           Jacalyn Lefevre, MD 07/20/20 2035

## 2022-01-12 IMAGING — CT CT ABD-PELV W/ CM
2 of 4 series · 16 of 46 positions shown, 18 images · IV contrast (Omnipaque)
Comparison: 11/04/2018

CLINICAL DATA: Acute abdominal pain for 1 week

EXAM:
CT ABDOMEN AND PELVIS WITH CONTRAST
TECHNIQUE: Multidetector CT imaging of the abdomen and pelvis was performed
using the standard protocol following bolus administration of
intravenous contrast.
CONTRAST:  100mL OMNIPAQUE IOHEXOL 300 MG/ML  SOLN

[Series 2: axial st · axial · 0.71mm/px · z∈[-486,-66]mm · 13 of 92 slices shown, 15 images]
[im 4/92  soft-tissue]
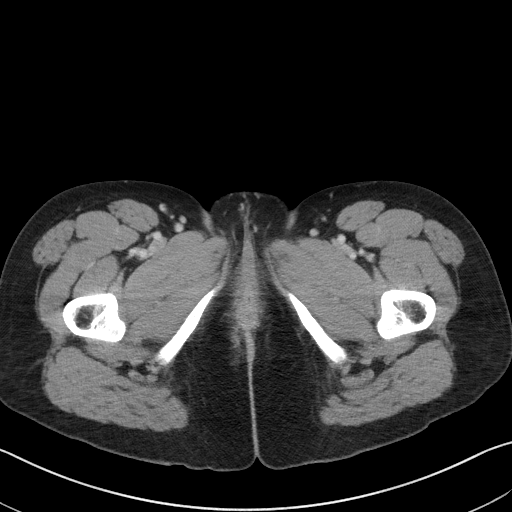
[im 4/92  bone]
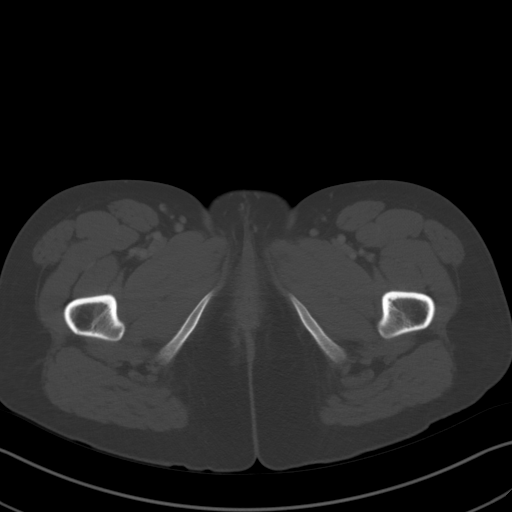
[im 11/92  soft-tissue]
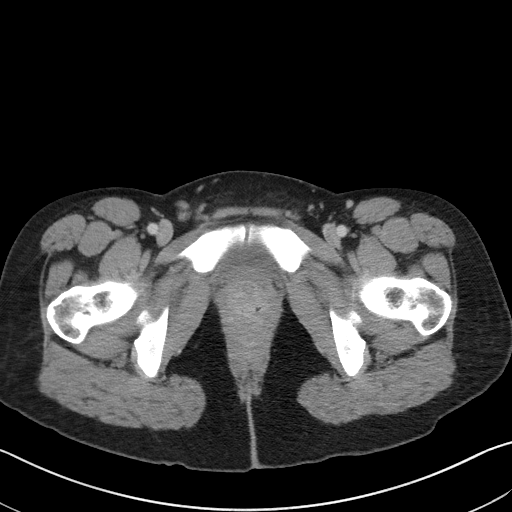
[im 18/92  soft-tissue]
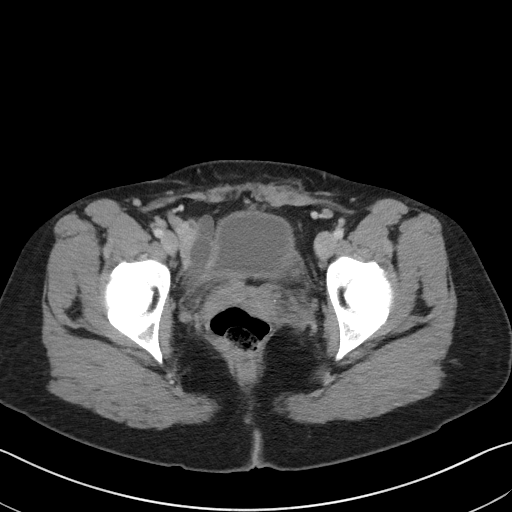
[im 25/92  soft-tissue]
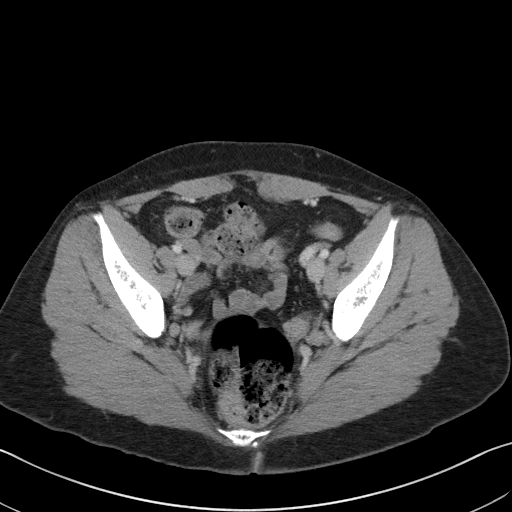
[im 32/92  soft-tissue]
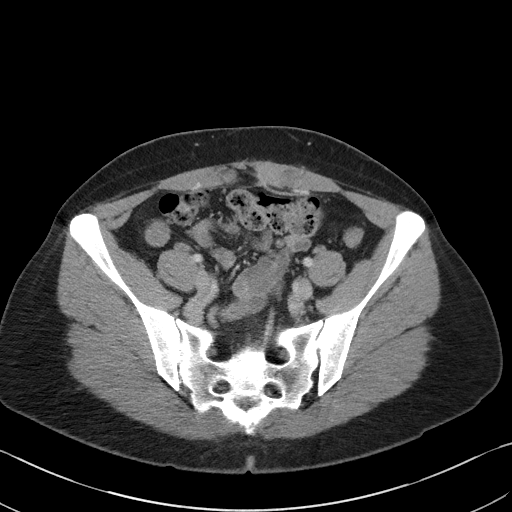
[im 39/92  soft-tissue]
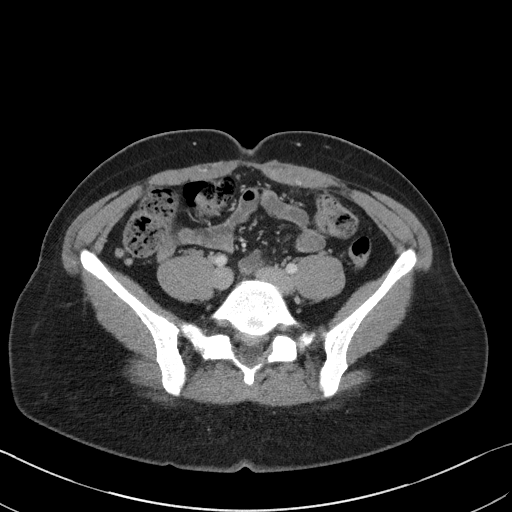
[im 46/92  soft-tissue]
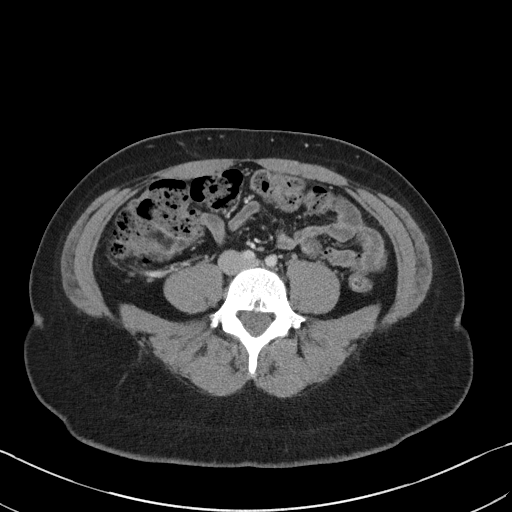
[im 53/92  soft-tissue]
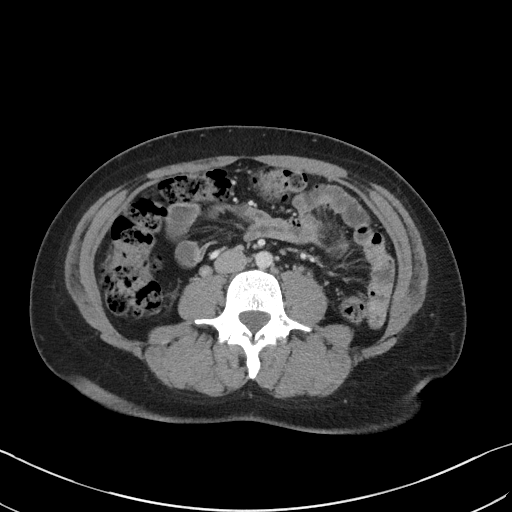
[im 60/92  soft-tissue]
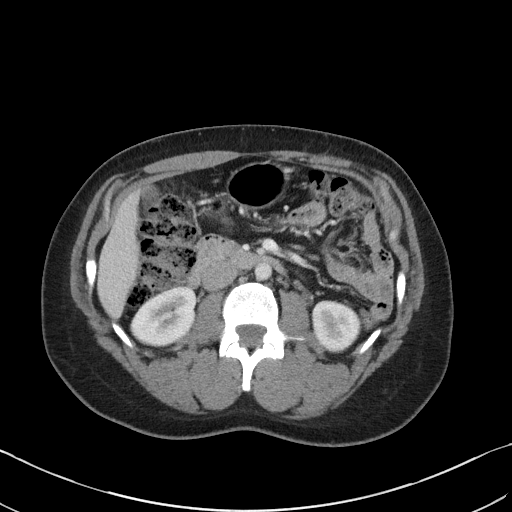
[im 60/92  bone]
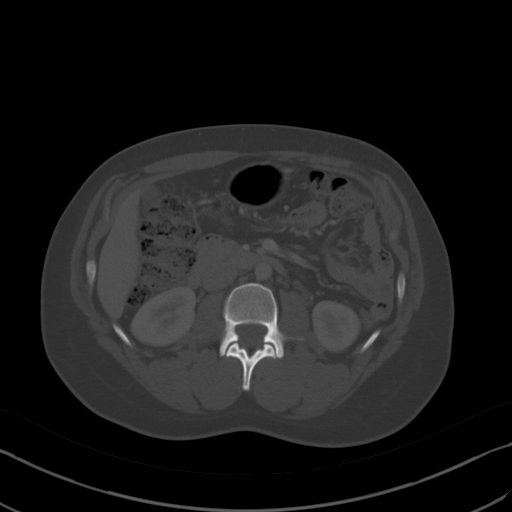
[im 67/92  soft-tissue]
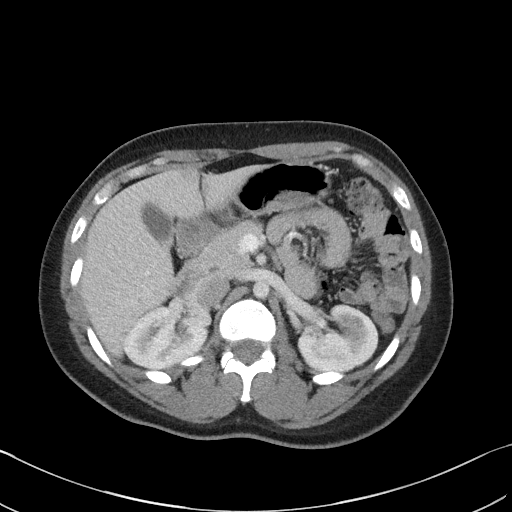
[im 74/92  soft-tissue]
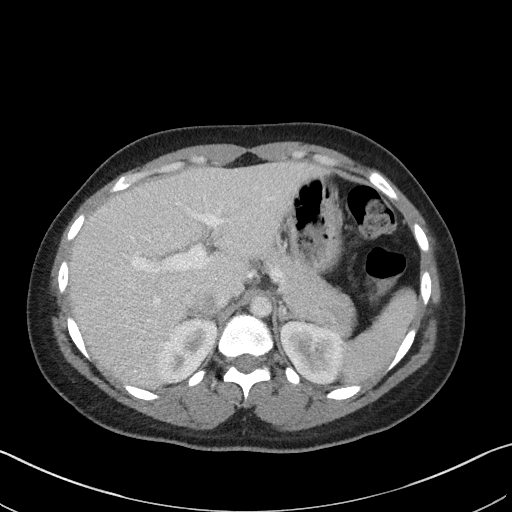
[im 81/92  soft-tissue]
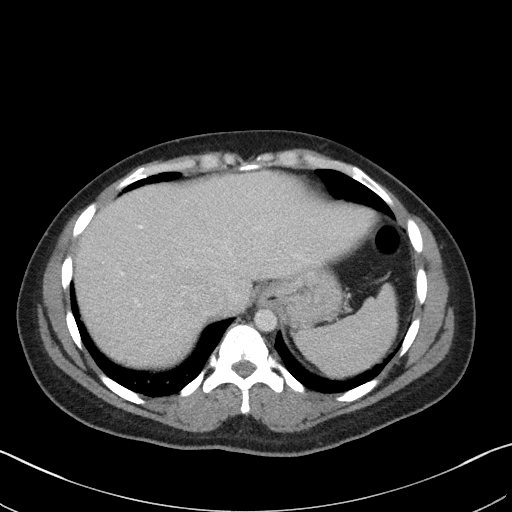
[im 88/92  soft-tissue]
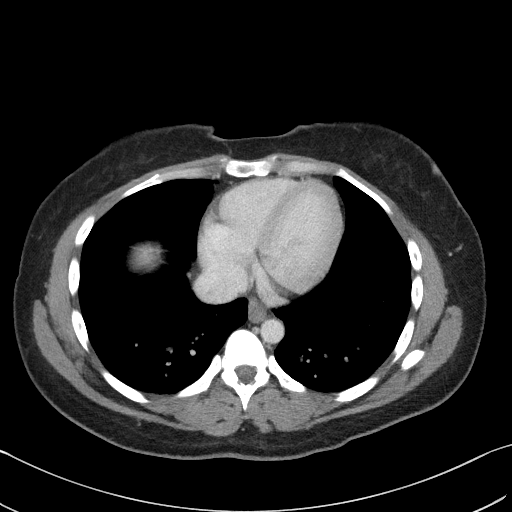

[Series 5: coronal st · coronal · 0.66mm/px · 3 of 81 slices shown]
[im 27/81  soft-tissue]
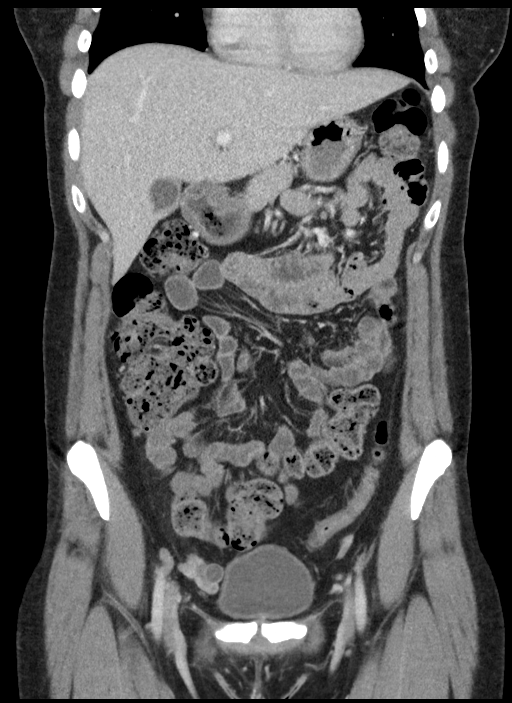
[im 36/81  soft-tissue]
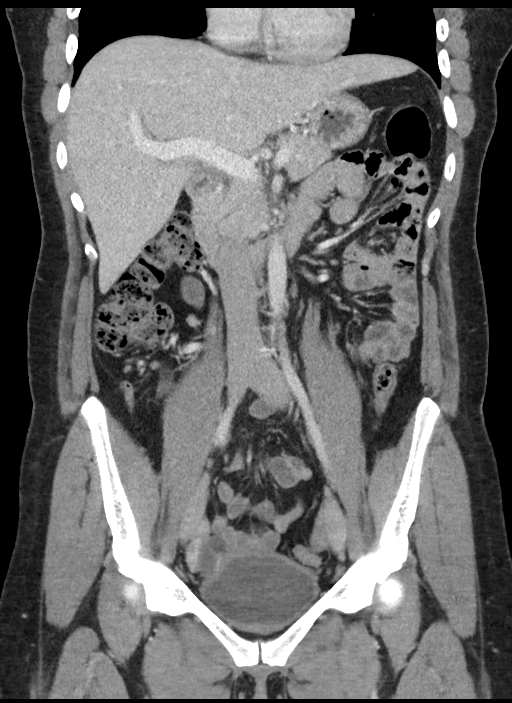
[im 45/81  soft-tissue]
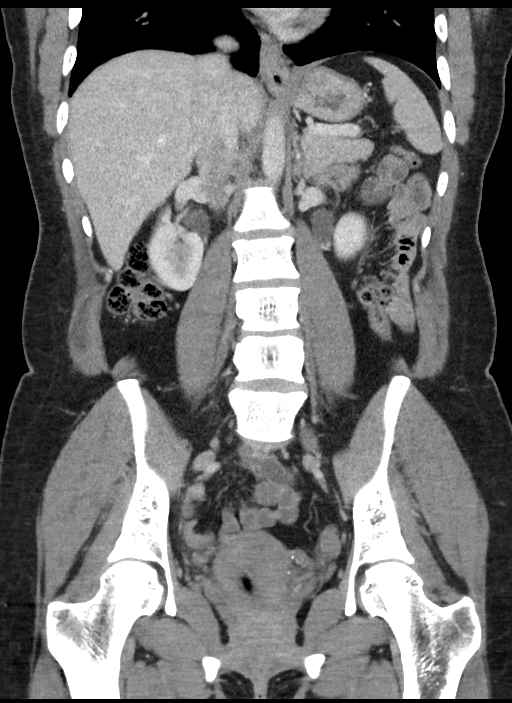

[16 of 46 positions shown; findings below may reference images not displayed]

FINDINGS: Lower chest: No acute abnormality.

Hepatobiliary: No focal liver abnormality is seen. No gallstones,
gallbladder wall thickening, or biliary dilatation.

Pancreas: Unremarkable. No pancreatic ductal dilatation or
surrounding inflammatory changes.

Spleen: Normal in size without focal abnormality.

Adrenals/Urinary Tract: Adrenal glands are within normal limits.
Kidneys are well visualized bilaterally. No renal calculi are seen.
Cystic lesion is again noted within the left kidney stable from the
prior study. No obstructive changes are seen. The bladder is
decompressed.

Stomach/Bowel: The appendix is within normal limits. No obstructive
or inflammatory changes of the colon are seen. Small bowel is
unremarkable. Stomach is decompressed.

Vascular/Lymphatic: No significant vascular findings are present. No
enlarged abdominal or pelvic lymph nodes.

Reproductive: Status post hysterectomy. Cystic changes are noted in
the right ovary slightly decreased when compare with the prior exam.

Other: No abdominal wall hernia or abnormality. No abdominopelvic
ascites.

Musculoskeletal: No acute or significant osseous findings.
IMPRESSION: Cystic lesion within the left kidney stable from the prior CT and
MRI.

Cystic change in the right ovary which have decreased in the
interval from the prior CT.

No other focal abnormality is noted.
